# Patient Record
Sex: Male | Born: 1955 | Race: White | Hispanic: No | Marital: Married | Smoking: Never smoker
Health system: Southern US, Community
[De-identification: ages and names within clinical notes are randomized; demographics above are authoritative.]

## PROBLEM LIST (undated history)

## (undated) DIAGNOSIS — I499 Cardiac arrhythmia, unspecified: Secondary | ICD-10-CM

## (undated) DIAGNOSIS — M5416 Radiculopathy, lumbar region: Secondary | ICD-10-CM

## (undated) DIAGNOSIS — G8929 Other chronic pain: Secondary | ICD-10-CM

## (undated) DIAGNOSIS — G4730 Sleep apnea, unspecified: Secondary | ICD-10-CM

## (undated) DIAGNOSIS — E7800 Pure hypercholesterolemia, unspecified: Secondary | ICD-10-CM

## (undated) DIAGNOSIS — M4310 Spondylolisthesis, site unspecified: Secondary | ICD-10-CM

## (undated) HISTORY — DX: Radiculopathy, lumbar region: M54.16

## (undated) HISTORY — DX: Spondylolisthesis, site unspecified: M43.10

## (undated) HISTORY — DX: Pure hypercholesterolemia, unspecified: E78.00

## (undated) HISTORY — PX: ORIF WRIST FRACTURE: SHX2133

## (undated) HISTORY — DX: Other chronic pain: G89.29

---

## 2001-12-12 HISTORY — PX: SHOULDER OPEN ROTATOR CUFF REPAIR: SHX2407

## 2005-12-12 HISTORY — PX: SHOULDER OPEN ROTATOR CUFF REPAIR: SHX2407

## 2005-12-12 HISTORY — PX: APPENDECTOMY: SHX54

## 2019-09-19 ENCOUNTER — Other Ambulatory Visit (HOSPITAL_BASED_OUTPATIENT_CLINIC_OR_DEPARTMENT_OTHER): Payer: Self-pay | Admitting: Chiropractic Medicine

## 2019-09-19 DIAGNOSIS — M545 Low back pain, unspecified: Secondary | ICD-10-CM

## 2019-09-19 DIAGNOSIS — M25552 Pain in left hip: Secondary | ICD-10-CM

## 2019-09-25 ENCOUNTER — Ambulatory Visit (HOSPITAL_BASED_OUTPATIENT_CLINIC_OR_DEPARTMENT_OTHER)
Admission: RE | Admit: 2019-09-25 | Discharge: 2019-09-25 | Disposition: A | Discharge status: Home / Self Care | Payer: 59 | Source: Ambulatory Visit | Attending: Chiropractic Medicine | Admitting: Chiropractic Medicine

## 2019-09-25 ENCOUNTER — Other Ambulatory Visit: Payer: Self-pay

## 2019-09-25 DIAGNOSIS — M25552 Pain in left hip: Secondary | ICD-10-CM | POA: Diagnosis present

## 2019-09-25 DIAGNOSIS — M545 Low back pain, unspecified: Secondary | ICD-10-CM

## 2020-01-03 ENCOUNTER — Encounter: Payer: Self-pay | Admitting: Vascular Surgery

## 2020-01-03 ENCOUNTER — Other Ambulatory Visit: Payer: Self-pay | Admitting: *Deleted

## 2020-01-03 ENCOUNTER — Ambulatory Visit: Payer: Self-pay | Admitting: Orthopedic Surgery

## 2020-01-13 ENCOUNTER — Telehealth (HOSPITAL_COMMUNITY): Payer: Self-pay

## 2020-01-13 NOTE — Telephone Encounter (Signed)

## 2020-01-14 ENCOUNTER — Other Ambulatory Visit: Payer: Self-pay

## 2020-01-14 ENCOUNTER — Ambulatory Visit (INDEPENDENT_AMBULATORY_CARE_PROVIDER_SITE_OTHER): Payer: 59 | Admitting: Vascular Surgery

## 2020-01-14 DIAGNOSIS — M5442 Lumbago with sciatica, left side: Secondary | ICD-10-CM | POA: Diagnosis not present

## 2020-01-14 DIAGNOSIS — G8929 Other chronic pain: Secondary | ICD-10-CM

## 2020-01-14 DIAGNOSIS — M549 Dorsalgia, unspecified: Secondary | ICD-10-CM | POA: Insufficient documentation

## 2020-01-14 NOTE — Progress Notes (Signed)
Patient name: Timothy Brock MRN: [id number] DOB: [birth year] Sex: male  REASON FOR CONSULT: Evaluation for L4-L5 OLIF  HPI: Timothy Brock is a 64 y.o. male, with history of hyperlipidemia as well as chronic lower back pain who presents for evaluation of planned L4-L5 OLIF with Dr. Rolena Infante.  Patient states he has had chronic lower back pain with radiculopathy down the left leg for 7+ months if not longer.  He has failed conservative management including therapy and injections.  Dr. Rolena Infante has now recommended L4-L5 OLIF with anterior and posterior approach.  His only history of abdominal surgery includes laparoscopic appendectomy.  He still works as an Chief Financial Officer.  No history of smoking.  Past Medical History:  Diagnosis Date  . Chronic back pain   . Degenerative spondylolisthesis   . Hypercholesterolemia   . Lumbar radiculopathy     PSH: Appendectomy, right shoulder, left shoulder, left wrist  Family History  Problem Relation Age of Onset  . Heart disease Father     SOCIAL HISTORY: Social History   Socioeconomic History  . Marital status: Married    Spouse name: Not on file  . Number of children: Not on file  . Years of education: Not on file  . Highest education level: Not on file  Occupational History  . Not on file  Tobacco Use  . Smoking status: Never Smoker  . Smokeless tobacco: Never Used  Substance and Sexual Activity  . Alcohol use: Yes  . Drug use: Not on file  . Sexual activity: Not on file  Other Topics Concern  . Not on file  Social History Narrative  . Not on file   Social Determinants of Health   Financial Resource Strain:   . Difficulty of Paying Living Expenses: Not on file  Food Insecurity:   . Worried About Charity fundraiser in the Last Year: Not on file  . Ran Out of Food in the Last Year: Not on file  Transportation Needs:   . Lack of Transportation (Medical): Not on file  . Lack of Transportation (Non-Medical): Not on file  Physical Activity:     . Days of Exercise per Week: Not on file  . Minutes of Exercise per Session: Not on file  Stress:   . Feeling of Stress : Not on file  Social Connections:   . Frequency of Communication with Friends and Family: Not on file  . Frequency of Social Gatherings with Friends and Family: Not on file  . Attends Religious Services: Not on file  . Active Member of Clubs or Organizations: Not on file  . Attends Archivist Meetings: Not on file  . Marital Status: Not on file  Intimate Partner Violence:   . Fear of Current or Ex-Partner: Not on file  . Emotionally Abused: Not on file  . Physically Abused: Not on file  . Sexually Abused: Not on file    No Known Allergies  Current Outpatient Medications  Medication Sig Dispense Refill  . aspirin 81 MG EC tablet Take by mouth.    . meloxicam (MOBIC) 15 MG tablet meloxicam 15 mg tablet  Take 1 tablet by mouth once daily    . Multiple Vitamin (MULTIVITAMIN) capsule Take by mouth.    . rosuvastatin (CRESTOR) 10 MG tablet Take by mouth.    . rosuvastatin (CRESTOR) 20 MG tablet Take by mouth.    . traMADol (ULTRAM) 50 MG tablet tramadol 50 mg tablet  TAKE 1 TABLET BY MOUTH THREE  TIMES DAILY AS NEEDED FOR 21 DAYS     No current facility-administered medications for this visit.    REVIEW OF SYSTEMS:  [X]  denotes positive finding, [ ]  denotes negative finding Cardiac  Comments:  Chest pain or chest pressure:    Shortness of breath upon exertion:    Short of breath when lying flat:    Irregular heart rhythm:        Vascular    Pain in calf, thigh, or hip brought on by ambulation:    Pain in feet at night that wakes you up from your sleep:     Blood clot in your veins:    Leg swelling:         Pulmonary    Oxygen at home:    Productive cough:     Wheezing:         Neurologic    Sudden weakness in arms or legs:     Sudden numbness in arms or legs:     Sudden onset of difficulty speaking or slurred speech:    Temporary loss  of vision in one eye:     Problems with dizziness:     Back pain x   Gastrointestinal    Blood in stool:     Vomited blood:         Genitourinary    Burning when urinating:     Blood in urine:        Psychiatric    Major depression:         Hematologic    Bleeding problems:    Problems with blood clotting too easily:        Skin    Rashes or ulcers:        Constitutional    Fever or chills:      PHYSICAL EXAM: Vitals:   01/14/20 1453  BP: (!) 149/89  Pulse: 91  Resp: 16  Temp: 97.9 F (36.6 C)  TempSrc: Temporal  SpO2: 94%  Weight: 268 lb (121.6 kg)  Height: 5\' 10"  (1.778 m)    GENERAL: The patient is a well-nourished male, in no acute distress. The vital signs are documented above. CARDIAC: There is a regular rate and rhythm.  VASCULAR:  Palpable femoral pulses bilaterally Palpable dorsalis pedis pulse bilaterally PULMONARY: There is good air exchange bilaterally without wheezing or rales. ABDOMEN: Soft and non-tender with normal pitched bowel sounds.  MUSCULOSKELETAL: There are no major deformities or cyanosis. NEUROLOGIC: No focal weakness or paresthesias are detected. SKIN: There are no ulcers or rashes noted. PSYCHIATRIC: The patient has a normal affect.  DATA:   I independently reviewed his MR lumbar spine and his iliac vein bifurcation appears to be approximate L4, aortic bifurcation closer to L3.  Assessment/Plan:  64 year old male with history of chronic lower back pain with left lower leg radiculopathy that presents for preop evaluation of planned L4-L5 OLIF with Dr. 03/13/20.  I have reviewed his MRI imaging and think he would be a good candidate for OLIF.  I discussed the steps of the operation in detail with the patient and his wife including lateral decubitus positioning with incision over left oblique muscles.  We discussed risks and benefits including risk of injury to the left iliac artery or vein as well as injury to the intestines or left  ureter that all require mobliziation.  Look forward to assisting Dr. .  Questions and concerns answered.   69, MD [facility] Office:  [phone]    

## 2020-01-20 ENCOUNTER — Ambulatory Visit: Payer: Self-pay | Admitting: Orthopedic Surgery

## 2020-01-20 NOTE — H&P (Signed)
Subjective:    Jahquan is a pleasant 64 year old male who has had significant low back pain since January 2020 was initially seen by Dr. Shon Baton in October 2020. Despite conservative treatment measures including time, over-the-counter medication, necrotic medication, and injection therapy is to have significant pain. His main source of pain is his low back although he does complain of intermittent dysesthesias into his leg left leg greater than right. Despite these conservative treatment measures, the patient continues to have severe, debilitating pain affecting his quality-of-life and he would like to move forward with surgical intervention. He is scheduled for OLIF L4-5 w/ PSFI 01/29/20 at [facility] With Dr. Shon Baton. Patient Active Problem List   Diagnosis Date Noted  . Back pain 01/14/2020   Past Medical History:  Diagnosis Date  . Chronic back pain   . Degenerative spondylolisthesis   . Hypercholesterolemia   . Lumbar radiculopathy      Current Outpatient Medications  Medication Sig Dispense Refill Last Dose  . aspirin 81 MG EC tablet Take 81 mg by mouth daily.      . meloxicam (MOBIC) 15 MG tablet Take 15 mg by mouth daily.      . Multiple Vitamin (MULTIVITAMIN) capsule Take 1 capsule by mouth daily.      . rosuvastatin (CRESTOR) 20 MG tablet Take 20 mg by mouth daily.      . traMADol (ULTRAM) 50 MG tablet Take 50 mg by mouth 3 (three) times daily as needed for moderate pain.       No current facility-administered medications for this visit.   No Known Allergies  Social History   Tobacco Use  . Smoking status: Never Smoker  . Smokeless tobacco: Never Used  Substance Use Topics  . Alcohol use: Yes    Family History  Problem Relation Age of Onset  . Heart disease Father     Review of Systems As stated in HPI  Objective:   Vitals: Ht: 5 ft 10 in 01/20/2020 03:33 pm Wt: 254 lbs 01/20/2020 03:35 pm BMI: 36.4 01/20/2020 03:35 pm BP: 162/95 sitting L arm 01/20/2020 03:37  pm Pulse: 83 bpm 01/20/2020 03:37 pm  Clinical exam: Irma is a pleasant individual, who appears younger than their stated age. He Is alert and orientated 3.  Heart: Regular rate and rhythm, no rubs, murmurs, or gallops  Lungs: Clear auscultation bilaterally  Abdomen is soft and non-tender, negative loss of bowel and bladder control, no rebound tenderness. Bowel sounds 4  Negative: skin lesions abrasions contusions  Peripheral pulses: 2+ dorsalis pedis and posterior tibialis pulses bilaterally. Compartment soft and nontender.  Gait pattern: Normal. Patient does have significant pain when he arises from a seated position  Assistive devices: None  Neuro: Positive left straight leg raise test with numbness and dysesthesias in the L5 dermatome. 5/5 motor strength bilaterally in the lower extremity. Symmetrical 2+ deep tendon reflexes at the knee and Achilles, negative Babinski test.  Musculoskeletal: Significant back and left lateral hip pain with direct palpation. Pain does radiate into the left lower extremity. He denies any significant hip, knee, ankle pain with isolated joint range of motion  X-rays of the lumbar spine demonstrate some mild degenerative disease at L4-5. No scoliosis or spondylolisthesis is noted.  Lumbar MRI: completed on 10/11/19 was reviewed with the patient. I have also reviewed the radiology report. Degenerative grade 1 slip at L4-5 with severe bilateral facet arthrosis and facet hypertrophy. Mild central stenosis, moderate lateral recess stenosis. There is a small left anteriorly directed  synovial cyst with moderate left neuroforaminal narrowing. Mild degenerative disease L3-4 and L5-S1. No abnormal marrow replacing lesion or fracture is seen.  Patient reports approximately 5 days of significant pain relief status post the epidural injection.  Assessment:   At this point time Advay states his quality-of-life is quite poor. He did note 4-5 days of improvement with  the epidural injection but now he is having debilitating pain. He is interested in moving forward with a surgical solution for his issues. His primary source of pain is back pain. He states that 80% of his problem revolves around his lumbar spine. While he does have some episodes of neuropathic leg pain it is minor compared to his back pain.  At this point time in order to address the degenerative spondylous thesis and back pain recommending an oblique lumbar interbody fusion. This would allow for removal of the painful disc and restoration of his sagittal alignment and stabilization of the intervertebral space with a cage. I would then supplement this with posterior pedicle screw fixation.  Plan:   I have gone over the risks and benefits of surgery with the patient and his wife in great detail and all of their questions were encouraged.  Risks, benefits of surgery were reviewed with the patient. These include: infection, bleeding, death, stroke, paralysis, ongoing or worse pain, need for additional surgery, injury to the lumbar plexus resulting in hip flexor weakness and difficulty walking without assistive devices. Adjacent segment degenerative disease, need for additional surgery including fusing other levels, leak of spinal fluid, Nonunion, hardware failure, breakage, or mal-position. Deep venous thrombosis (DVT) requiring additional treatment such as filter, and/or medications. Injury to abdominal contents, loss in bowel and bladder control.  Risks and benefits of spinal fusion: Infection, bleeding, death, stroke, paralysis, ongoing or worse pain, need for additional surgery, nonunion, leak of spinal fluid, adjacent segment degeneration requiring additional fusion surgery, Injury to abdominal vessels that can require anterior surgery to stop bleeding. Malposition of the cage and/or pedicle screws that could require additional surgery. Loss of bowel and bladder control. Postoperative hematoma causing  neurologic compression that could require urgent or emergent re-operation.  We have obtained preoperative clearance from the patient's primary care provider. The primary care provider wrote that the patient should "continue all medications" however, he was on meloxicam and aspirin and therefore we will contact the patient's primary care provider and make sure that he is able to discontinue these medications without significant increased risk. The patient states that his primary care doctor said the aspirin was "up to the surgeon," however I would like updated documentation if this is the case.  We have reviewed his medication list. He is taking aspirin 81 mg, which I would recommend he hold 7 days prior to surgery and we will restart 48 hours after. I have also advised him to hold his meloxicam. I have also advised him not to take any over-the-counter anti-inflammatory medications. He will take Tylenol and tramadol as needed for pain.  We have also discussed the post-operative recovery period to include: bathing/showering restrictions, wound healing, activity (and driving) restrictions, medications/pain mangement.  We have also discussed post-operative redflags to include: signs and symptoms of postoperative infection, DVT/PE.  Patient will be fitted for LSO brace by physical therapy today  Patient has preoperative testing at [facility] scheduled  All patients questions were invited and answered  Plan is to move forward with surgical intervention as scheduled pending preoperative labs at [facility].  F/u: 2  weeks post-op

## 2020-01-24 NOTE — Progress Notes (Signed)
[facility] [address] [place], [place] - [address] [place] [address] Phone: [phone] Fax: [phone]      Your procedure is scheduled on Wednesday Jan 29, 2020.  Report to [facility] Main Entrance "A" at 06:30 A.M., and check in at the Admitting office.  Call this number if you have problems the morning of surgery:  [phone]  Call [phone] if you have any questions prior to your surgery date Monday-Friday 8am-4pm    Remember:  Do not eat or drink after midnight the night before your surgery   Take these medicines the morning of surgery with A SIP OF WATER: rosuvastatin (CRESTOR) traMADol (ULTRAM) - as needed  Follow your surgeon's instructions on when to stop Aspirin.  If no instructions were given by your surgeon then you will need to call the office to get those instructions.    7 days prior to surgery STOP taking any meloxicam (MOBIC), Aleve, Naproxen, Ibuprofen, Motrin, Advil, Goody's, BC's, all herbal medications, fish oil, and all vitamins.    The Morning of Surgery  Do not wear jewelry.  Do not wear lotions, powders, colognes, or deodorant  Men may shave face and neck.  Do not bring valuables to the hospital.  [facility] is not responsible for any belongings or valuables.  If you are a smoker, DO NOT Smoke 24 hours prior to surgery  If you wear a CPAP at night please bring your mask the morning of surgery   Remember that you must have someone to transport you home after your surgery, and remain with you for 24 hours if you are discharged the same day.   Please bring cases for contacts, glasses, hearing aids, dentures or bridgework because it cannot be worn into surgery.    Leave your suitcase in the car.  After surgery it may be brought to your room.  For patients admitted to the hospital, discharge time will be determined by your treatment team.  Patients discharged the day of surgery will not be  allowed to drive home.    Special instructions:   [facility]- Preparing For Surgery  Before surgery, you can play an important role. Because skin is not sterile, your skin needs to be as free of germs as possible. You can reduce the number of germs on your skin by washing with CHG (chlorahexidine gluconate) Soap before surgery.  CHG is an antiseptic cleaner which kills germs and bonds with the skin to continue killing germs even after washing.    Oral Hygiene is also important to reduce your risk of infection.  Remember - BRUSH YOUR TEETH THE MORNING OF SURGERY WITH YOUR REGULAR TOOTHPASTE  Please do not use if you have an allergy to CHG or antibacterial soaps. If your skin becomes reddened/irritated stop using the CHG.  Do not shave (including legs and underarms) for at least 48 hours prior to first CHG shower. It is OK to shave your face.  Please follow these instructions carefully.   1. Shower the NIGHT BEFORE SURGERY and the MORNING OF SURGERY with CHG Soap.   2. If you chose to wash your hair, wash your hair first as usual with your normal shampoo.  3. After you shampoo, rinse your hair and body thoroughly to remove the shampoo.  4. Use CHG as you would any other liquid soap. You can apply CHG directly to the skin and wash gently with a scrungie or a clean washcloth.  5. Apply the CHG Soap to your body ONLY FROM THE NECK DOWN.  Do not use on open wounds or open sores. Avoid contact with your eyes, ears, mouth and genitals (private parts). Wash Face and genitals (private parts)  with your normal soap.   6. Wash thoroughly, paying special attention to the area where your surgery will be performed.  7. Thoroughly rinse your body with warm water from the neck down.  8. DO NOT shower/wash with your normal soap after using and rinsing off the CHG Soap.  9. Pat yourself dry with a CLEAN TOWEL.  10. Wear CLEAN PAJAMAS to bed the night before surgery, wear comfortable clothes the  morning of surgery  11. Place CLEAN SHEETS on your bed the night of your first shower and DO NOT SLEEP WITH PETS.    Day of Surgery:  Please shower the morning of surgery with the CHG soap Do not apply any deodorants/lotions. Please wear clean clothes to the hospital/surgery center.   Remember to brush your teeth WITH YOUR REGULAR TOOTHPASTE.   Please read over the following fact sheets that you were given.

## 2020-01-27 ENCOUNTER — Other Ambulatory Visit: Payer: Self-pay

## 2020-01-27 ENCOUNTER — Other Ambulatory Visit (HOSPITAL_COMMUNITY)
Admission: RE | Admit: 2020-01-27 | Discharge: 2020-01-27 | Disposition: A | Discharge status: Home / Self Care | Payer: 59 | Source: Ambulatory Visit | Attending: Orthopedic Surgery | Admitting: Orthopedic Surgery

## 2020-01-27 ENCOUNTER — Encounter (HOSPITAL_COMMUNITY): Payer: Self-pay

## 2020-01-27 ENCOUNTER — Ambulatory Visit (HOSPITAL_COMMUNITY)
Admission: RE | Admit: 2020-01-27 | Discharge: 2020-01-27 | Disposition: A | Discharge status: Home / Self Care | Payer: 59 | Source: Ambulatory Visit | Attending: Orthopedic Surgery | Admitting: Orthopedic Surgery

## 2020-01-27 ENCOUNTER — Encounter (HOSPITAL_COMMUNITY)
Admission: RE | Admit: 2020-01-27 | Discharge: 2020-01-27 | Disposition: A | Discharge status: Home / Self Care | Payer: 59 | Source: Ambulatory Visit | Attending: Orthopedic Surgery | Admitting: Orthopedic Surgery

## 2020-01-27 DIAGNOSIS — Z01818 Encounter for other preprocedural examination: Secondary | ICD-10-CM

## 2020-01-27 HISTORY — DX: Cardiac arrhythmia, unspecified: I49.9

## 2020-01-27 HISTORY — DX: Sleep apnea, unspecified: G47.30

## 2020-01-27 LAB — ABO/RH: ABO/RH(D): A POS

## 2020-01-27 LAB — TYPE AND SCREEN
ABO/RH(D): A POS
Antibody Screen: NEGATIVE

## 2020-01-27 LAB — BASIC METABOLIC PANEL
Anion gap: 13 (ref 5–15)
BUN: 18 mg/dL (ref 8–23)
CO2: 22 mmol/L (ref 22–32)
Calcium: 9.2 mg/dL (ref 8.9–10.3)
Chloride: 107 mmol/L (ref 98–111)
Creatinine, Ser: 0.89 mg/dL (ref 0.61–1.24)
GFR calc Af Amer: >60 mL/min (ref >60)
GFR calc non Af Amer: >60 mL/min (ref >60)
Glucose, Bld: 109 mg/dL — ABNORMAL HIGH (ref 70–99)
Potassium: 4.1 mmol/L (ref 3.5–5.1)
Sodium: 142 mmol/L (ref 135–145)

## 2020-01-27 LAB — CBC
HCT: 49.8 % (ref 39.0–52.0)
Hemoglobin: 16.6 g/dL (ref 13.0–17.0)
MCH: 31.9 pg (ref 26.0–34.0)
MCHC: 33.3 g/dL (ref 30.0–36.0)
MCV: 95.6 fL (ref 80.0–100.0)
Platelets: 203 10*3/uL (ref 150–400)
RBC: 5.21 MIL/uL (ref 4.22–5.81)
RDW: 12.7 % (ref 11.5–15.5)
WBC: 4.6 10*3/uL (ref 4.0–10.5)
nRBC: 0 % (ref 0.0–0.2)

## 2020-01-27 LAB — SURGICAL PCR SCREEN
MRSA, PCR: NEGATIVE
Staphylococcus aureus: NEGATIVE

## 2020-01-27 LAB — URINALYSIS, ROUTINE W REFLEX MICROSCOPIC
Bilirubin Urine: NEGATIVE
Glucose, UA: NEGATIVE mg/dL
Hgb urine dipstick: NEGATIVE
Ketones, ur: NEGATIVE mg/dL
Leukocytes,Ua: NEGATIVE
Nitrite: NEGATIVE
Protein, ur: NEGATIVE mg/dL
Specific Gravity, Urine: 1.023 (ref 1.005–1.030)
pH: 5 (ref 5.0–8.0)

## 2020-01-27 LAB — APTT: aPTT: 32 seconds (ref 24–36)

## 2020-01-27 LAB — SARS CORONAVIRUS 2 (TAT 6-24 HRS): SARS Coronavirus 2: NEGATIVE

## 2020-01-27 LAB — PROTIME-INR
INR: 1 (ref 0.8–1.2)
Prothrombin Time: 12.6 seconds (ref 11.4–15.2)

## 2020-01-27 NOTE — Progress Notes (Addendum)
PCP - Dr. Lynn Ito Cardiologist - denies  PPM/ICD - denies  Chest x-ray -  01/27/2020 EKG - 11/21/2019 (C.E.) - tracing requested Stress Test - per patient about 23-30 years ago ECHO - denies Cardiac Cath - denies  Sleep Study - 2019 (C.E) CPAP - per patient has never worn a CPAP  Blood Thinner Instructions: N/A Aspirin Instructions: per patient LD 01/20/2020  ERAS Protcol - No  COVID TEST- Scheduled for today after PAT appointment. Patient verbalized understanding of self-quarantine instructions, appointment time and place.  Anesthesia review: YES, per surgeon's order, medical clearance  Patient denies shortness of breath, fever, cough and chest pain at PAT appointment  All instructions explained to the patient, with a verbal understanding of the material. Patient agrees to go over the instructions while at home for a better understanding. Patient also instructed to self quarantine after being tested for COVID-19. The opportunity to ask questions was provided.

## 2020-01-27 NOTE — Anesthesia Preprocedure Evaluation (Addendum)
Anesthesia Evaluation  Patient identified by MRN, date of birth, ID band Patient awake    Reviewed: Allergy & Precautions, NPO status , Patient's Chart, lab work & pertinent test results  Airway Mallampati: II  TM Distance: >3 FB Neck ROM: Full    Dental no notable dental hx.    Pulmonary sleep apnea ,    Pulmonary exam normal breath sounds clear to auscultation       Cardiovascular negative cardio ROS Normal cardiovascular exam Rhythm:Regular Rate:Normal     Neuro/Psych negative neurological ROS  negative psych ROS   GI/Hepatic negative GI ROS, Neg liver ROS,   Endo/Other  Morbid obesity  Renal/GU negative Renal ROS  negative genitourinary   Musculoskeletal negative musculoskeletal ROS (+)   Abdominal   Peds negative pediatric ROS (+)  Hematology negative hematology ROS (+)   Anesthesia Other Findings   Reproductive/Obstetrics negative OB ROS                             Anesthesia Physical Anesthesia Plan  ASA: III  Anesthesia Plan: General   Post-op Pain Management:    Induction: Intravenous  PONV Risk Score and Plan: 2 and Ondansetron, Dexamethasone and Treatment may vary due to age or medical condition  Airway Management Planned: Oral ETT  Additional Equipment:   Intra-op Plan:   Post-operative Plan: Extubation in OR  Informed Consent: I have reviewed the patients History and Physical, chart, labs and discussed the procedure including the risks, benefits and alternatives for the proposed anesthesia with the patient or authorized representative who has indicated his/her understanding and acceptance.     Dental advisory given  Plan Discussed with: CRNA and Surgeon  Anesthesia Plan Comments: (PAT note written 01/27/2020 by Shonna Chock, PA-C. )       Anesthesia Quick Evaluation

## 2020-01-27 NOTE — Progress Notes (Signed)
Anesthesia Chart Review:  Case: [id number] Date/Time: 01/29/20 0815   Procedures:      Obliquie lumbar interbody fusion L4-5 wiht posterior spinal fusion interbody (N/A ) - 4.5 hrs Dr. Carlis Abbott to do approach Left sided tap block with exparel     ABDOMINAL EXPOSURE (N/A )   Anesthesia type: General   Pre-op diagnosis: Grade 1 degenerative sliip L4-5 with back and radicular leg pain   Location: [facility] ROOM 04 / [facility] OR   Surgeons: Melina Schools, MD; [name], MD      DISCUSSION: Patient is a 64 year old male scheduled for the above procedure.  History includes never smoker, hypercholesterolemia, chronic back pain, OSA (does not wear CPAP), dysrhythmia (report remote history of "skipped" beats treated with diltiazem). Tested positive for SARS-CoV2 08/20/19 ([place]).  Last ASA 01/20/20. His PCP Dr. Sinclair Ship cleared him for surgery following 11/21/19 visit with EKG and labs (EKG tracing requested). He reportedly was diagnosed with OSA > 10 years ago, but has never worn CPAP--her note indicates that he was unable to sleep very much in the sleep lab, so he did not feel his initial OSA diagnosis was accurate. He denied SOB, cough, fever, chest pain at PAT RN visit. 01/27/20 COVID-19 presurgical test is in process. If negative then I would anticipate that he can proceed as planned if no acute changes.    VS: BP (!) 136/94   Pulse 91   Temp 36.9 C (Oral)   Resp 18   Ht 5\' 10"  (1.778 m)   Wt 120.4 kg   SpO2 95%   BMI 38.10 kg/m  As of 01/24/20, home BP checks were ranging 126-18/80's.   PROVIDERS: Sinclair Ship, MD is PCP ([place] [facility]; see Care Everywhere). Seen for preoperative evaluation 01/21/19.    LABS: Labs reviewed: Acceptable for surgery. A1c 6.1% 11/18/19 [facility]). (all labs ordered are listed, but only abnormal results are displayed)  Labs Reviewed  BASIC METABOLIC PANEL - Abnormal; Notable for the following components:      Result Value    Glucose, Bld 109 (*)    All other components within normal limits  SURGICAL PCR SCREEN  APTT  CBC  PROTIME-INR  URINALYSIS, ROUTINE W REFLEX MICROSCOPIC  TYPE AND SCREEN  ABO/RH     IMAGES: CXR 01/27/20: FINDINGS: The heart size and mediastinal contours are within normal limits. Both lungs are clear. The visualized skeletal structures are unremarkable. IMPRESSION: No active cardiopulmonary disease.   EKG: 11/21/19 [facility]): Tracing Requested. Per Result Narrative: Sinus rhythm Normal ECG No previous ECGs available Confirmed by Larae Grooms ([id number]) on 11/21/2019 9:25:49 PM   CV: Reported remote history of stress test (> 20 years ago) around the time of his "skipped" beats.    Past Medical History:  Diagnosis Date  . Chronic back pain   . Degenerative spondylolisthesis   . Dysrhythmia    per patient about 23-30 years ago "heart would skip a beat was on cardizem for it"  . Hypercholesterolemia   . Lumbar radiculopathy   . Sleep apnea    per patient diagnosed "about 20 years ago, has never worn a CPAP machine" Care Everywhere shows recent sleep study done in 2019    Past Surgical History:  Procedure Laterality Date  . APPENDECTOMY  2007  . ORIF WRIST FRACTURE Left   . SHOULDER OPEN ROTATOR CUFF REPAIR Right 2003  . SHOULDER OPEN ROTATOR CUFF REPAIR Left 2007  MEDICATIONS: . aspirin 81 MG EC tablet  . meloxicam (MOBIC) 15 MG tablet  . Multiple Vitamin (MULTIVITAMIN) capsule  . rosuvastatin (CRESTOR) 20 MG tablet  . traMADol (ULTRAM) 50 MG tablet   No current facility-administered medications for this encounter.    Shonna Chock, PA-C Surgical Short Stay/Anesthesiology [facility] Phone [phone] [facility] Phone [phone] 01/27/2020 4:03 PM

## 2020-01-28 MED ORDER — DEXTROSE 5 % IV SOLN
3.0000 g | INTRAVENOUS | Status: AC
  Administered 2020-01-29 (×2): 3 g via INTRAVENOUS
  Filled 2020-01-28: qty 3

## 2020-01-29 ENCOUNTER — Other Ambulatory Visit: Payer: Self-pay

## 2020-01-29 ENCOUNTER — Inpatient Hospital Stay (HOSPITAL_COMMUNITY): Payer: 59 | Admitting: Physician Assistant

## 2020-01-29 ENCOUNTER — Encounter (HOSPITAL_COMMUNITY)
Admission: RE | Disposition: A | Discharge status: Home / Self Care | Payer: Self-pay | Source: Home / Self Care | Attending: Orthopedic Surgery

## 2020-01-29 ENCOUNTER — Inpatient Hospital Stay (HOSPITAL_COMMUNITY): Payer: 59

## 2020-01-29 ENCOUNTER — Inpatient Hospital Stay (HOSPITAL_COMMUNITY)
Admission: RE | Admit: 2020-01-29 | Discharge: 2020-01-30 | DRG: 460 | Disposition: A | Discharge status: Home / Self Care | Payer: 59 | Attending: Orthopedic Surgery | Admitting: Orthopedic Surgery

## 2020-01-29 ENCOUNTER — Encounter (HOSPITAL_COMMUNITY): Payer: Self-pay | Admitting: Orthopedic Surgery

## 2020-01-29 ENCOUNTER — Inpatient Hospital Stay (HOSPITAL_COMMUNITY): Payer: 59 | Admitting: Certified Registered"

## 2020-01-29 DIAGNOSIS — E669 Obesity, unspecified: Secondary | ICD-10-CM | POA: Diagnosis present

## 2020-01-29 DIAGNOSIS — Z6838 Body mass index (BMI) 38.0-38.9, adult: Secondary | ICD-10-CM

## 2020-01-29 DIAGNOSIS — Z791 Long term (current) use of non-steroidal anti-inflammatories (NSAID): Secondary | ICD-10-CM | POA: Diagnosis not present

## 2020-01-29 DIAGNOSIS — M4316 Spondylolisthesis, lumbar region: Secondary | ICD-10-CM | POA: Diagnosis present

## 2020-01-29 DIAGNOSIS — M4326 Fusion of spine, lumbar region: Secondary | ICD-10-CM | POA: Diagnosis present

## 2020-01-29 DIAGNOSIS — R066 Hiccough: Secondary | ICD-10-CM | POA: Diagnosis not present

## 2020-01-29 DIAGNOSIS — Z20822 Contact with and (suspected) exposure to covid-19: Secondary | ICD-10-CM | POA: Diagnosis present

## 2020-01-29 DIAGNOSIS — G8929 Other chronic pain: Secondary | ICD-10-CM | POA: Diagnosis present

## 2020-01-29 DIAGNOSIS — Z7982 Long term (current) use of aspirin: Secondary | ICD-10-CM | POA: Diagnosis not present

## 2020-01-29 DIAGNOSIS — Z5309 Procedure and treatment not carried out because of other contraindication: Secondary | ICD-10-CM | POA: Diagnosis present

## 2020-01-29 DIAGNOSIS — E7800 Pure hypercholesterolemia, unspecified: Secondary | ICD-10-CM | POA: Diagnosis present

## 2020-01-29 DIAGNOSIS — Z79899 Other long term (current) drug therapy: Secondary | ICD-10-CM | POA: Diagnosis not present

## 2020-01-29 DIAGNOSIS — Z419 Encounter for procedure for purposes other than remedying health state, unspecified: Secondary | ICD-10-CM

## 2020-01-29 DIAGNOSIS — G4730 Sleep apnea, unspecified: Secondary | ICD-10-CM | POA: Diagnosis present

## 2020-01-29 DIAGNOSIS — M5416 Radiculopathy, lumbar region: Secondary | ICD-10-CM | POA: Diagnosis present

## 2020-01-29 DIAGNOSIS — Z8249 Family history of ischemic heart disease and other diseases of the circulatory system: Secondary | ICD-10-CM | POA: Diagnosis not present

## 2020-01-29 HISTORY — PX: ANTERIOR LUMBAR FUSION: SHX1170

## 2020-01-29 HISTORY — PX: ABDOMINAL EXPOSURE: SHX5708

## 2020-01-29 SURGERY — ANTERIOR LUMBAR FUSION 1 LEVEL
Anesthesia: General | Site: Spine Lumbar

## 2020-01-29 MED ORDER — ONDANSETRON HCL 4 MG/2ML IJ SOLN
INTRAMUSCULAR | Status: DC | PRN
  Administered 2020-01-29: 4 mg via INTRAVENOUS

## 2020-01-29 MED ORDER — MIDAZOLAM HCL 5 MG/5ML IJ SOLN
INTRAMUSCULAR | Status: DC | PRN
  Administered 2020-01-29: 2 mg via INTRAVENOUS

## 2020-01-29 MED ORDER — BUPIVACAINE LIPOSOME 1.3 % IJ SUSP
20.0000 mL | INTRAMUSCULAR | Status: DC
  Filled 2020-01-29: qty 20

## 2020-01-29 MED ORDER — EPINEPHRINE PF 1 MG/ML IJ SOLN
INTRAMUSCULAR | Status: DC | PRN
  Administered 2020-01-29: .15 mL

## 2020-01-29 MED ORDER — MORPHINE SULFATE (PF) 2 MG/ML IV SOLN: 2.0000 mg | INTRAVENOUS | Status: DC | PRN

## 2020-01-29 MED ORDER — KETOROLAC TROMETHAMINE 30 MG/ML IJ SOLN
INTRAMUSCULAR | Status: AC
  Filled 2020-01-29: qty 1

## 2020-01-29 MED ORDER — GABAPENTIN 300 MG PO CAPS
300.0000 mg | ORAL_CAPSULE | Freq: Three times a day (TID) | ORAL | Status: DC
  Administered 2020-01-29 – 2020-01-30 (×3): 300 mg via ORAL
  Filled 2020-01-29 (×3): qty 1

## 2020-01-29 MED ORDER — FENTANYL CITRATE (PF) 250 MCG/5ML IJ SOLN
INTRAMUSCULAR | Status: AC
  Filled 2020-01-29: qty 5

## 2020-01-29 MED ORDER — ACETAMINOPHEN 325 MG PO TABS
650.0000 mg | ORAL_TABLET | ORAL | Status: DC | PRN
  Administered 2020-01-29 – 2020-01-30 (×2): 650 mg via ORAL
  Filled 2020-01-29 (×2): qty 2

## 2020-01-29 MED ORDER — LIDOCAINE IN D5W 4-5 MG/ML-% IV SOLN
INTRAVENOUS | Status: DC | PRN
  Administered 2020-01-29: 2 mg/min via INTRAVENOUS

## 2020-01-29 MED ORDER — DEXAMETHASONE SODIUM PHOSPHATE 10 MG/ML IJ SOLN
INTRAMUSCULAR | Status: DC | PRN
  Administered 2020-01-29: 4 mg via INTRAVENOUS

## 2020-01-29 MED ORDER — KETOROLAC TROMETHAMINE 30 MG/ML IJ SOLN
30.0000 mg | Freq: Once | INTRAMUSCULAR | Status: AC | PRN
  Administered 2020-01-29: 30 mg via INTRAVENOUS

## 2020-01-29 MED ORDER — POLYETHYLENE GLYCOL 3350 17 G PO PACK: 17.0000 g | PACK | Freq: Every day | ORAL | Status: DC | PRN

## 2020-01-29 MED ORDER — LACTATED RINGERS IV SOLN: INTRAVENOUS | Status: DC | PRN

## 2020-01-29 MED ORDER — LACTATED RINGERS IV SOLN: INTRAVENOUS | Status: DC

## 2020-01-29 MED ORDER — TRANEXAMIC ACID-NACL 1000-0.7 MG/100ML-% IV SOLN
INTRAVENOUS | Status: DC | PRN
  Administered 2020-01-29: 1000 mg via INTRAVENOUS

## 2020-01-29 MED ORDER — BUPIVACAINE HCL (PF) 0.25 % IJ SOLN
INTRAMUSCULAR | Status: AC
  Filled 2020-01-29: qty 30

## 2020-01-29 MED ORDER — THROMBIN (RECOMBINANT) 5000 UNITS EX SOLR
CUTANEOUS | Status: AC
  Filled 2020-01-29: qty 5000

## 2020-01-29 MED ORDER — ONDANSETRON HCL 4 MG PO TABS: 4.0000 mg | ORAL_TABLET | Freq: Four times a day (QID) | ORAL | Status: DC | PRN

## 2020-01-29 MED ORDER — BUPIVACAINE HCL (PF) 0.25 % IJ SOLN
INTRAMUSCULAR | Status: DC | PRN
  Administered 2020-01-29: 10 mL

## 2020-01-29 MED ORDER — ROCURONIUM BROMIDE 10 MG/ML (PF) SYRINGE
PREFILLED_SYRINGE | INTRAVENOUS | Status: AC
  Filled 2020-01-29: qty 10

## 2020-01-29 MED ORDER — DEXAMETHASONE 4 MG PO TABS
4.0000 mg | ORAL_TABLET | Freq: Four times a day (QID) | ORAL | Status: DC
  Administered 2020-01-29: 4 mg via ORAL
  Filled 2020-01-29: qty 1

## 2020-01-29 MED ORDER — ONDANSETRON HCL 4 MG/2ML IJ SOLN: 4.0000 mg | Freq: Four times a day (QID) | INTRAMUSCULAR | Status: DC | PRN

## 2020-01-29 MED ORDER — METHOCARBAMOL 500 MG PO TABS: 500.0000 mg | ORAL_TABLET | Freq: Three times a day (TID) | ORAL | 0 refills | Status: AC | PRN

## 2020-01-29 MED ORDER — 0.9 % SODIUM CHLORIDE (POUR BTL) OPTIME
TOPICAL | Status: DC | PRN
  Administered 2020-01-29 (×3): 1000 mL

## 2020-01-29 MED ORDER — SODIUM CHLORIDE 0.9 % IV SOLN: 250.0000 mL | INTRAVENOUS | Status: DC

## 2020-01-29 MED ORDER — SODIUM CHLORIDE 0.9% FLUSH: 3.0000 mL | INTRAVENOUS | Status: DC | PRN

## 2020-01-29 MED ORDER — LIDOCAINE IN D5W 4-5 MG/ML-% IV SOLN
1.0000 mg/min | INTRAVENOUS | Status: DC
  Filled 2020-01-29: qty 500

## 2020-01-29 MED ORDER — LIDOCAINE 2% (20 MG/ML) 5 ML SYRINGE
INTRAMUSCULAR | Status: AC
  Filled 2020-01-29: qty 5

## 2020-01-29 MED ORDER — CHLORHEXIDINE GLUCONATE 4 % EX LIQD: 60.0000 mL | Freq: Once | CUTANEOUS | Status: DC

## 2020-01-29 MED ORDER — MENTHOL 3 MG MT LOZG: 1.0000 | LOZENGE | OROMUCOSAL | Status: DC | PRN

## 2020-01-29 MED ORDER — DOCUSATE SODIUM 100 MG PO CAPS
100.0000 mg | ORAL_CAPSULE | Freq: Two times a day (BID) | ORAL | Status: DC
  Administered 2020-01-29: 100 mg via ORAL
  Filled 2020-01-29 (×2): qty 1

## 2020-01-29 MED ORDER — OXYCODONE HCL 5 MG/5ML PO SOLN: 5.0000 mg | Freq: Once | ORAL | Status: DC | PRN

## 2020-01-29 MED ORDER — METHOCARBAMOL 500 MG PO TABS
500.0000 mg | ORAL_TABLET | Freq: Four times a day (QID) | ORAL | Status: DC | PRN
  Administered 2020-01-29 – 2020-01-30 (×2): 500 mg via ORAL
  Filled 2020-01-29 (×2): qty 1

## 2020-01-29 MED ORDER — HYDROMORPHONE HCL 1 MG/ML IJ SOLN
INTRAMUSCULAR | Status: DC | PRN
  Administered 2020-01-29: 1 mg via INTRAVENOUS

## 2020-01-29 MED ORDER — HYDROMORPHONE HCL 1 MG/ML IJ SOLN
INTRAMUSCULAR | Status: AC
  Filled 2020-01-29: qty 0.5

## 2020-01-29 MED ORDER — ONDANSETRON HCL 4 MG PO TABS: 4.0000 mg | ORAL_TABLET | Freq: Three times a day (TID) | ORAL | 0 refills | Status: AC | PRN

## 2020-01-29 MED ORDER — OXYCODONE-ACETAMINOPHEN 10-325 MG PO TABS: 1.0000 | ORAL_TABLET | Freq: Four times a day (QID) | ORAL | 0 refills | Status: AC | PRN

## 2020-01-29 MED ORDER — HEMOSTATIC AGENTS (NO CHARGE) OPTIME
TOPICAL | Status: DC | PRN
  Administered 2020-01-29: 2

## 2020-01-29 MED ORDER — HYDROMORPHONE HCL 1 MG/ML IJ SOLN
0.2500 mg | INTRAMUSCULAR | Status: DC | PRN
  Administered 2020-01-29: 0.25 mg via INTRAVENOUS

## 2020-01-29 MED ORDER — PHENOL 1.4 % MT LIQD: 1.0000 | OROMUCOSAL | Status: DC | PRN

## 2020-01-29 MED ORDER — TRANEXAMIC ACID-NACL 1000-0.7 MG/100ML-% IV SOLN
INTRAVENOUS | Status: AC
  Filled 2020-01-29: qty 100

## 2020-01-29 MED ORDER — ROCURONIUM BROMIDE 10 MG/ML (PF) SYRINGE
PREFILLED_SYRINGE | INTRAVENOUS | Status: DC | PRN
  Administered 2020-01-29: 80 mg via INTRAVENOUS
  Administered 2020-01-29: 10 mg via INTRAVENOUS
  Administered 2020-01-29: 20 mg via INTRAVENOUS
  Administered 2020-01-29: 10 mg via INTRAVENOUS

## 2020-01-29 MED ORDER — ROPIVACAINE HCL 5 MG/ML IJ SOLN
INTRAMUSCULAR | Status: DC | PRN
  Administered 2020-01-29: 30 mL

## 2020-01-29 MED ORDER — SODIUM CHLORIDE 0.9% FLUSH: 3.0000 mL | Freq: Two times a day (BID) | INTRAVENOUS | Status: DC

## 2020-01-29 MED ORDER — PROMETHAZINE HCL 25 MG/ML IJ SOLN: 6.2500 mg | INTRAMUSCULAR | Status: DC | PRN

## 2020-01-29 MED ORDER — FLEET ENEMA 7-19 GM/118ML RE ENEM: 1.0000 | ENEMA | Freq: Once | RECTAL | Status: DC | PRN

## 2020-01-29 MED ORDER — MAGNESIUM CITRATE PO SOLN
1.0000 | Freq: Once | ORAL | Status: AC
  Administered 2020-01-29: 1 via ORAL
  Filled 2020-01-29: qty 296

## 2020-01-29 MED ORDER — HYDROMORPHONE HCL 1 MG/ML IJ SOLN
INTRAMUSCULAR | Status: AC
  Filled 2020-01-29: qty 1

## 2020-01-29 MED ORDER — PHENYLEPHRINE HCL-NACL 10-0.9 MG/250ML-% IV SOLN
INTRAVENOUS | Status: DC | PRN
  Administered 2020-01-29: 20 ug/min via INTRAVENOUS

## 2020-01-29 MED ORDER — CEFAZOLIN SODIUM-DEXTROSE 1-4 GM/50ML-% IV SOLN
1.0000 g | Freq: Three times a day (TID) | INTRAVENOUS | Status: AC
  Administered 2020-01-29 – 2020-01-30 (×2): 1 g via INTRAVENOUS
  Filled 2020-01-29 (×2): qty 50

## 2020-01-29 MED ORDER — LIDOCAINE 2% (20 MG/ML) 5 ML SYRINGE
INTRAMUSCULAR | Status: DC | PRN
  Administered 2020-01-29: 100 mg via INTRAVENOUS

## 2020-01-29 MED ORDER — SUGAMMADEX SODIUM 200 MG/2ML IV SOLN
INTRAVENOUS | Status: DC | PRN
  Administered 2020-01-29: 240 mg via INTRAVENOUS

## 2020-01-29 MED ORDER — OXYCODONE HCL 5 MG PO TABS: 5.0000 mg | ORAL_TABLET | Freq: Once | ORAL | Status: DC | PRN

## 2020-01-29 MED ORDER — DEXAMETHASONE SODIUM PHOSPHATE 4 MG/ML IJ SOLN
4.0000 mg | Freq: Four times a day (QID) | INTRAMUSCULAR | Status: DC
  Administered 2020-01-29 – 2020-01-30 (×2): 4 mg via INTRAVENOUS
  Filled 2020-01-29 (×2): qty 1

## 2020-01-29 MED ORDER — OXYCODONE HCL 5 MG PO TABS
5.0000 mg | ORAL_TABLET | ORAL | Status: DC | PRN
  Administered 2020-01-29 – 2020-01-30 (×4): 10 mg via ORAL
  Filled 2020-01-29 (×4): qty 2

## 2020-01-29 MED ORDER — METHOCARBAMOL 1000 MG/10ML IJ SOLN
500.0000 mg | Freq: Four times a day (QID) | INTRAVENOUS | Status: DC | PRN
  Filled 2020-01-29: qty 5

## 2020-01-29 MED ORDER — EPINEPHRINE PF 1 MG/ML IJ SOLN
INTRAMUSCULAR | Status: AC
  Filled 2020-01-29: qty 1

## 2020-01-29 MED ORDER — MIDAZOLAM HCL 2 MG/2ML IJ SOLN
INTRAMUSCULAR | Status: AC
  Filled 2020-01-29: qty 2

## 2020-01-29 MED ORDER — PROPOFOL 10 MG/ML IV BOLUS
INTRAVENOUS | Status: AC
  Filled 2020-01-29: qty 20

## 2020-01-29 MED ORDER — FENTANYL CITRATE (PF) 250 MCG/5ML IJ SOLN
INTRAMUSCULAR | Status: DC | PRN
  Administered 2020-01-29 (×8): 50 ug via INTRAVENOUS

## 2020-01-29 MED ORDER — ACETAMINOPHEN 650 MG RE SUPP: 650.0000 mg | RECTAL | Status: DC | PRN

## 2020-01-29 MED ORDER — PROPOFOL 10 MG/ML IV BOLUS
INTRAVENOUS | Status: DC | PRN
  Administered 2020-01-29: 200 mg via INTRAVENOUS

## 2020-01-29 MED ORDER — ONDANSETRON HCL 4 MG/2ML IJ SOLN
INTRAMUSCULAR | Status: AC
  Filled 2020-01-29: qty 2

## 2020-01-29 SURGICAL SUPPLY — 115 items
APPLIER CLIP 11 MED OPEN (CLIP) ×8
APPLIER CLIP 13 LRG OPEN (CLIP) ×4
BLADE CLIPPER SURG (BLADE) IMPLANT
BLADE SURG 10 STRL SS (BLADE) ×4 IMPLANT
BONE VIVIGEN FORMABLE 10CC (Bone Implant) ×4 IMPLANT
BUR EGG ELITE 4.0 (BURR) ×3 IMPLANT
BUR EGG ELITE 4.0MM (BURR) ×1
CABLE BIPOLOR RESECTION CORD (MISCELLANEOUS) ×4 IMPLANT
CATH FOLEY 2WAY SLVR  5CC 16FR (CATHETERS) ×2
CATH FOLEY 2WAY SLVR 5CC 16FR (CATHETERS) ×2 IMPLANT
CLIP APPLIE 11 MED OPEN (CLIP) ×4 IMPLANT
CLIP APPLIE 13 LRG OPEN (CLIP) IMPLANT
CLIP LIGATING EXTRA MED SLVR (CLIP) ×4 IMPLANT
CLIP LIGATING EXTRA SM BLUE (MISCELLANEOUS) ×4 IMPLANT
CLIP NEUROVISION LG (CLIP) ×2 IMPLANT
CLOSURE STERI-STRIP 1/2X4 (GAUZE/BANDAGES/DRESSINGS)
CLOSURE WOUND 1/2 X4 (GAUZE/BANDAGES/DRESSINGS) ×1
CLSR STERI-STRIP ANTIMIC 1/2X4 (GAUZE/BANDAGES/DRESSINGS) IMPLANT
COVER SURGICAL LIGHT HANDLE (MISCELLANEOUS) ×4 IMPLANT
COVER WAND RF STERILE (DRAPES) ×8 IMPLANT
DERMABOND ADVANCED (GAUZE/BANDAGES/DRESSINGS) ×2
DERMABOND ADVANCED .7 DNX12 (GAUZE/BANDAGES/DRESSINGS) ×2 IMPLANT
DRAPE C-ARM 42X72 X-RAY (DRAPES) ×8 IMPLANT
DRAPE C-ARMOR (DRAPES) ×4 IMPLANT
DRAPE POUCH INSTRU U-SHP 10X18 (DRAPES) ×4 IMPLANT
DRAPE SURG 17X23 STRL (DRAPES) ×4 IMPLANT
DRAPE U-SHAPE 47X51 STRL (DRAPES) ×4 IMPLANT
DRSG OPSITE POSTOP 3X4 (GAUZE/BANDAGES/DRESSINGS) ×2 IMPLANT
DRSG OPSITE POSTOP 4X6 (GAUZE/BANDAGES/DRESSINGS) ×2 IMPLANT
DRSG OPSITE POSTOP 4X8 (GAUZE/BANDAGES/DRESSINGS) ×4 IMPLANT
DURAPREP 26ML APPLICATOR (WOUND CARE) ×4 IMPLANT
ELECT BLADE 4.0 EZ CLEAN MEGAD (MISCELLANEOUS) ×8
ELECT CAUTERY BLADE 6.4 (BLADE) ×4 IMPLANT
ELECT PENCIL ROCKER SW 15FT (MISCELLANEOUS) ×4 IMPLANT
ELECT REM PT RETURN 9FT ADLT (ELECTROSURGICAL) ×4
ELECTRODE BLDE 4.0 EZ CLN MEGD (MISCELLANEOUS) ×4 IMPLANT
ELECTRODE REM PT RTRN 9FT ADLT (ELECTROSURGICAL) ×2 IMPLANT
GAUZE 4X4 16PLY RFD (DISPOSABLE) IMPLANT
GLOVE BIO SURGEON STRL SZ 6.5 (GLOVE) ×3 IMPLANT
GLOVE BIO SURGEON STRL SZ7.5 (GLOVE) ×4 IMPLANT
GLOVE BIO SURGEONS STRL SZ 6.5 (GLOVE) ×1
GLOVE BIOGEL PI IND STRL 6.5 (GLOVE) ×2 IMPLANT
GLOVE BIOGEL PI IND STRL 8 (GLOVE) ×2 IMPLANT
GLOVE BIOGEL PI IND STRL 8.5 (GLOVE) ×4 IMPLANT
GLOVE BIOGEL PI INDICATOR 6.5 (GLOVE) ×2
GLOVE BIOGEL PI INDICATOR 8 (GLOVE) ×2
GLOVE BIOGEL PI INDICATOR 8.5 (GLOVE) ×4
GLOVE SS BIOGEL STRL SZ 7.5 (GLOVE) ×2 IMPLANT
GLOVE SS BIOGEL STRL SZ 8.5 (GLOVE) ×4 IMPLANT
GLOVE SUPERSENSE BIOGEL SZ 7.5 (GLOVE) ×2
GLOVE SUPERSENSE BIOGEL SZ 8.5 (GLOVE) ×4
GOWN STRL REUS W/ TWL LRG LVL3 (GOWN DISPOSABLE) ×2 IMPLANT
GOWN STRL REUS W/ TWL XL LVL3 (GOWN DISPOSABLE) ×2 IMPLANT
GOWN STRL REUS W/TWL 2XL LVL3 (GOWN DISPOSABLE) ×12 IMPLANT
GOWN STRL REUS W/TWL LRG LVL3 (GOWN DISPOSABLE) ×2
GOWN STRL REUS W/TWL XL LVL3 (GOWN DISPOSABLE) ×2
GRAFT BNE MATRIX VG FRMBL L 10 (Bone Implant) IMPLANT
GUIDEWIRE NITINOL BEVEL TIP (WIRE) ×8 IMPLANT
HEMOSTAT SNOW SURGICEL 2X4 (HEMOSTASIS) IMPLANT
HEMOSTAT SURGICEL 2X14 (HEMOSTASIS) ×4 IMPLANT
INSERT FOGARTY 61MM (MISCELLANEOUS) IMPLANT
INSERT FOGARTY SM (MISCELLANEOUS) IMPLANT
KIT BASIN OR (CUSTOM PROCEDURE TRAY) ×4 IMPLANT
KIT HARVESTER BONE 10 DISP (KITS) ×2 IMPLANT
KIT TURNOVER KIT B (KITS) ×4 IMPLANT
LIGHT SOURCE STRAIGHT TIP (INSTRUMENTS) ×4 IMPLANT
LOOP VESSEL MAXI BLUE (MISCELLANEOUS) IMPLANT
LOOP VESSEL MINI RED (MISCELLANEOUS) IMPLANT
MODULE EMG NDL SSEP NVM5 (NEEDLE) IMPLANT
MODULE EMG NEEDLE SSEP NVM5 (NEEDLE) ×4 IMPLANT
MODULE NVM5 NEXT GEN EMG (NEEDLE) ×2 IMPLANT
NDL SPNL 18GX3.5 QUINCKE PK (NEEDLE) ×2 IMPLANT
NEEDLE SPNL 18GX3.5 QUINCKE PK (NEEDLE) ×4 IMPLANT
NS IRRIG 1000ML POUR BTL (IV SOLUTION) ×4 IMPLANT
PACK LAMINECTOMY ORTHO (CUSTOM PROCEDURE TRAY) ×4 IMPLANT
PACK UNIVERSAL I (CUSTOM PROCEDURE TRAY) ×4 IMPLANT
PAD ARMBOARD 7.5X6 YLW CONV (MISCELLANEOUS) ×16 IMPLANT
PROBE BALL TIP NVM5 SNG USE (BALLOONS) ×4 IMPLANT
ROD RELINE MAS LORD 5.5X50 (Rod) ×4 IMPLANT
SCREW LOCK RELINE 5.5 TULIP (Screw) ×8 IMPLANT
SCREW RELINE RED 6.5X45MM POLY (Screw) ×8 IMPLANT
SPACER OLIF 12X55 6D (Spacer) ×2 IMPLANT
SPONGE INTESTINAL PEANUT (DISPOSABLE) ×12 IMPLANT
SPONGE LAP 18X18 RF (DISPOSABLE) IMPLANT
SPONGE LAP 4X18 RFD (DISPOSABLE) IMPLANT
SPONGE SURGIFOAM ABS GEL 100 (HEMOSTASIS) IMPLANT
STAPLER VISISTAT 35W (STAPLE) IMPLANT
STRIP CLOSURE SKIN 1/2X4 (GAUZE/BANDAGES/DRESSINGS) ×1 IMPLANT
SURGIFLO W/THROMBIN 8M KIT (HEMOSTASIS) ×8 IMPLANT
SUT BONE WAX W31G (SUTURE) ×4 IMPLANT
SUT MNCRL AB 3-0 PS2 18 (SUTURE) ×4 IMPLANT
SUT MON AB 3-0 SH 27 (SUTURE) ×2
SUT MON AB 3-0 SH27 (SUTURE) ×2 IMPLANT
SUT PDS AB 1 CTX 36 (SUTURE) ×4 IMPLANT
SUT PROLENE 4 0 RB 1 (SUTURE)
SUT PROLENE 4-0 RB1 .5 CRCL 36 (SUTURE) IMPLANT
SUT PROLENE 5 0 C 1 24 (SUTURE) IMPLANT
SUT PROLENE 5 0 CC1 (SUTURE) IMPLANT
SUT PROLENE 6 0 C 1 30 (SUTURE) ×4 IMPLANT
SUT PROLENE 6 0 CC (SUTURE) IMPLANT
SUT SILK 0 TIES 10X30 (SUTURE) ×4 IMPLANT
SUT SILK 2 0 TIES 10X30 (SUTURE) ×8 IMPLANT
SUT SILK 2 0SH CR/8 30 (SUTURE) IMPLANT
SUT SILK 3 0 TIES 10X30 (SUTURE) ×4 IMPLANT
SUT SILK 3 0 TIES 17X18 (SUTURE) ×2
SUT SILK 3 0SH CR/8 30 (SUTURE) IMPLANT
SUT SILK 3-0 18XBRD TIE BLK (SUTURE) ×2 IMPLANT
SUT VIC AB 1 CT1 27 (SUTURE) ×4
SUT VIC AB 1 CT1 27XBRD ANBCTR (SUTURE) ×4 IMPLANT
SUT VIC AB 2-0 CT1 18 (SUTURE) ×4 IMPLANT
SYR BULB IRRIGATION 50ML (SYRINGE) ×4 IMPLANT
TOWEL GREEN STERILE (TOWEL DISPOSABLE) ×8 IMPLANT
TOWEL GREEN STERILE FF (TOWEL DISPOSABLE) ×4 IMPLANT
TRAP SPECIMEN MUCOUS 40CC (MISCELLANEOUS) ×4 IMPLANT
WATER STERILE IRR 1000ML POUR (IV SOLUTION) ×4 IMPLANT

## 2020-01-29 NOTE — OR Nursing (Signed)
Attempted to get autograft from iliac; unsuccessful

## 2020-01-29 NOTE — Discharge Instructions (Signed)

## 2020-01-29 NOTE — Anesthesia Procedure Notes (Signed)
Anesthesia Procedure Image    

## 2020-01-29 NOTE — Brief Op Note (Signed)
01/29/2020  2:16 PM  PATIENT:  Timothy Brock  64 y.o. 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 Chestine Spore to do approach Left sided tap block with exparel ABDOMINAL EXPOSURE (N/A)  SURGEON:  Surgeon(s) and Role: Panel 1:    Venita Lick, MD - Primary Panel 2:    Cephus Shelling, MD - Primary  PHYSICIAN ASSISTANT:   ASSISTANTS: Amanda Ward., PA   ANESTHESIA:   general  EBL:  200 mL   BLOOD ADMINISTERED:none  DRAINS: none   LOCAL MEDICATIONS USED:  MARCAINE     SPECIMEN:  No Specimen  DISPOSITION OF SPECIMEN:  N/A  COUNTS:  YES  TOURNIQUET:  * No tourniquets in log *  DICTATION: .Dragon Dictation  PLAN OF CARE: Admit to inpatient   PATIENT DISPOSITION:  PACU - hemodynamically stable.

## 2020-01-29 NOTE — Transfer of Care (Signed)
Immediate Anesthesia Transfer of Care Note  Patient: Timothy Brock  Procedure(s) Performed: Oblique lumbar interbody fusion Lumbar four-five with posterior spinal interbody fusion (N/A Spine Lumbar) ABDOMINAL EXPOSURE (N/A )  Patient Location: PACU  Anesthesia Type:General  Level of Consciousness: awake, alert  and oriented  Airway & Oxygen Therapy: Patient Spontanous Breathing and Patient connected to face mask oxygen  Post-op Assessment: Report given to RN, Post -op Vital signs reviewed and stable and Patient moving all extremities  Post vital signs: Reviewed and stable  Last Vitals:  Vitals Value Taken Time  BP 142/98 01/29/20 1503  Temp    Pulse 114 01/29/20 1509  Resp 26 01/29/20 1509  SpO2 95 % 01/29/20 1509  Vitals shown include unvalidated device data.  Last Pain:  Vitals:   01/29/20 0704  TempSrc: Oral  PainSc:       Patients Stated Pain Goal: 3 (01/29/20 4276)  Complications: No apparent anesthesia complications

## 2020-01-29 NOTE — H&P (Signed)
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  The various methods of treatment have been discussed with the patient and family. After consideration of risks, benefits and other options for treatment, the patient has consented to  Procedure(s) with comments: Oblique lumbar interbody fusion L4-5 with posterior spinal interbody fusion (N/A) - 4.5 hrs Dr. Carlis Abbott to do approach Left sided tap block with exparel ABDOMINAL EXPOSURE (N/A) as a surgical intervention.  The patient's history has been reviewed, patient examined, no change in status, stable for surgery.  I have reviewed the patient's chart and labs.  Questions were answered to the patient's satisfaction.    L4-L5 OLIF exposure.  Risks and benefits previously discussed in detail.  Timothy Brock  Patient name: Timothy Brock MRN: [id number] DOB: 01/01/1956 Sex: male  REASON FOR CONSULT: Evaluation for L4-L5 OLIF  HPI:  Timothy Brock is a 64 y.o. male, with history of hyperlipidemia as well as chronic lower back pain who presents for evaluation of planned L4-L5 OLIF with Dr. Rolena Infante. Patient states he has had chronic lower back pain with radiculopathy down the left leg for 7+ months if not longer. He has failed conservative management including therapy and injections. Dr. Rolena Infante has now recommended L4-L5 OLIF with anterior and posterior approach. His only history of abdominal surgery includes laparoscopic appendectomy. He still works as an Chief Financial Officer. No history of smoking.      Past Medical History:  Diagnosis Date  . Chronic back pain   . Degenerative spondylolisthesis   . Hypercholesterolemia   . Lumbar radiculopathy    PSH: Appendectomy, right shoulder, left shoulder, left wrist       Family History  Problem Relation Age of Onset  . Heart disease Father    SOCIAL HISTORY:  Social History         Socioeconomic History  . Marital status: Married    Spouse name: Not on file  . Number of children: Not on file  . Years of education: Not on file  . Highest education level: Not on file  Occupational History  . Not on file  Tobacco Use  . Smoking status: Never Smoker  . Smokeless tobacco: Never Used  Substance and Sexual Activity  . Alcohol use: Yes  . Drug use: Not on file  . Sexual activity: Not on file  Other Topics Concern  . Not on file  Social History Narrative  . Not on file   Social Determinants of Health      Financial Resource Strain:   . Difficulty of Paying Living Expenses: Not on file  Food Insecurity:   . Worried About Charity fundraiser in the Last Year: Not on file  . Ran Out of Food in the Last Year: Not on file  Transportation Needs:   . Lack of Transportation (Medical): Not on file  . Lack of Transportation (Non-Medical): Not on file  Physical Activity:   . Days of Exercise per Week: Not on file  . Minutes of Exercise per Session: Not on file  Stress:   . Feeling of Stress : Not on file  Social Connections:   . Frequency of Communication with Friends and Family: Not on file  . Frequency of Social Gatherings with Friends and Family: Not on file  . Attends Religious Services: Not on file  . Active Member of Clubs or  Organizations: Not on file  . Attends Banker Meetings: Not on file  . Marital Status: Not on file  Intimate Partner Violence:   . Fear of Current or Ex-Partner: Not on file  . Emotionally Abused: Not on file  . Physically Abused: Not on file  . Sexually Abused: Not on file   No Known Allergies        Current Outpatient Medications  Medication Sig Dispense Refill  . aspirin 81 MG EC tablet Take by mouth.    . meloxicam (MOBIC) 15 MG tablet meloxicam 15 mg tablet  Take 1 tablet by mouth once daily    . Multiple Vitamin (MULTIVITAMIN) capsule Take by mouth.    . rosuvastatin (CRESTOR) 10 MG tablet Take by mouth.     . rosuvastatin (CRESTOR) 20 MG tablet Take by mouth.    . traMADol (ULTRAM) 50 MG tablet tramadol 50 mg tablet  TAKE 1 TABLET BY MOUTH THREE TIMES DAILY AS NEEDED FOR 21 DAYS     No current facility-administered medications for this visit.   REVIEW OF SYSTEMS:  [X]  denotes positive finding, [ ]  denotes negative finding  Cardiac  Comments:  Chest pain or chest pressure:    Shortness of breath upon exertion:    Short of breath when lying flat:    Irregular heart rhythm:        Vascular    Pain in calf, thigh, or hip brought on by ambulation:    Pain in feet at night that wakes you up from your sleep:     Blood clot in your veins:    Leg swelling:         Pulmonary    Oxygen at home:    Productive cough:     Wheezing:         Neurologic    Sudden weakness in arms or legs:     Sudden numbness in arms or legs:     Sudden onset of difficulty speaking or slurred speech:    Temporary loss of vision in one eye:     Problems with dizziness:     Back pain x   Gastrointestinal    Blood in stool:     Vomited blood:         Genitourinary    Burning when urinating:     Blood in urine:        Psychiatric    Major depression:         Hematologic    Bleeding problems:    Problems with blood clotting too easily:        Skin    Rashes or ulcers:        Constitutional    Fever or chills:    PHYSICAL EXAM:     Vitals:   01/14/20 1453  BP: (!) 149/89  Pulse: 91  Resp: 16  Temp: 97.9 F (36.6 C)  TempSrc: Temporal  SpO2: 94%  Weight: 268 lb (121.6 kg)  Height: 5\' 10"  (1.778 m)   GENERAL: The patient is a well-nourished male, in no acute distress. The vital signs are documented above.  CARDIAC: There is a regular rate and rhythm.  VASCULAR:  Palpable femoral pulses bilaterally  Palpable dorsalis pedis pulse bilaterally  PULMONARY: There is good air exchange bilaterally without wheezing or rales.  ABDOMEN: Soft and non-tender with normal pitched bowel sounds.   MUSCULOSKELETAL: There are no major deformities or cyanosis.  NEUROLOGIC: No focal weakness or paresthesias are detected.  SKIN: There are no ulcers or rashes noted.  PSYCHIATRIC: The patient has a normal affect.  DATA:  I independently reviewed his MR lumbar spine and his iliac vein bifurcation appears to be approximate L4, aortic bifurcation closer to L3.  Assessment/Plan:  64 year old male with history of chronic lower back pain with left lower leg radiculopathy that presents for preop evaluation of planned L4-L5 OLIF with Dr. Shon Baton. I have reviewed his MRI imaging and think he would be a good candidate for OLIF. I discussed the steps of the operation in detail with the patient and his wife including lateral decubitus positioning with incision over left oblique muscles. We discussed risks and benefits including risk of injury to the left iliac artery or vein as well as injury to the intestines or left ureter that all require mobliziation. Look forward to assisting Dr. Shon Baton. Questions and concerns answered.  Cephus Shelling, MD  [facility]  Office: [phone]

## 2020-01-29 NOTE — H&P (Signed)
Addendum H&P There has been no change in the patient's clinical exam since his last office visit of 01/20/2020.  Patient presents today because of ongoing significant back buttock and radicular leg pain.  Imaging studies demonstrated degenerative grade 1 spondylolisthesis at L4-5 with severe bilateral facet arthrosis and lateral recess and foraminal narrowing.  As result of the failure of conservative management consisting of physical and injection therapy, and the ongoing regular/neuropathic leg pain we have elected to move forward with surgery.  I have discussed the risks and benefits of the oblique lumbar interbody fusion with posterior supplemental pedicle screw fixation with the patient and all of his questions were encouraged and addressed.

## 2020-01-29 NOTE — Op Note (Addendum)
Operative report  Preoperative diagnosis: Degenerative spondylolisthesis L4-5 with radicular left leg pain  Postoperative diagnosis: Same  Operative procedure: Oblique lumbar interbody fusion L4-5.  Posterior pedicle screw fusion L4-5.  Approach surgeon: Dr. Clotilde Dieter  First assistant: Marchelle Folks Ward  Complications: Attempted to harvest iliac crest for bone graft for intervertebral cage.  Despite multiple attempts due to his body habitus I was unable to place the bone harvesting device properly to harvest graft.  As a result we elected to move forward with using vivogen.  Implants: Medtronic pivot intervertebral cage.  12 x 55.  NuVasive MIS pedicle screws: 6.5 x 45 with 50 mm rods.  EBL: 200  Cell Saver: 0  Neuro monitoring: All 4 pedicle screws were directly stimulated with no adverse activity at greater than 40 mA.  No abnormal EMG/SSEP activity throughout the case.  Indications: Timothy Brock is a very pleasant 64 year old obese gentleman with significant back buttock and radicular leg pain.  Attempts at conservative management had failed to alleviate his symptoms and his overall quality of life is continued to deteriorate.  As a result of the failure, we elected to move forward with surgery.  All appropriate risks benefits and alternatives were discussed with the patient and consent was obtained.  Operative report patient was brought the operating room placed on the operating room table.  After successful induction of general anesthesia and endotracheal the patient teds SCDs and a Foley were inserted.  The neuro monitoring rep then attached all appropriate monitoring devices for intraoperative EMG and SSEP monitoring.  The patient was then turned into the lateral decubitus position left side up.  Axillary roll was placed and the knee and ankle were well-padded.  Using fluoroscopy we properly position the patient so we could clearly identify the L4-5 disc space level.  The patient was secured to  the table with tape to prevent any abnormal motion during surgery.  Timeout was taken to confirm patient procedure and all other important data.  At this point time Dr. Chestine Spore performed a standard oblique retroperitoneal approach to the L4-5 level.  Please refer to his dictation for specifics.  Once he had the retractor properly positioned and the L4-5 level exposed  Dr. Chestine Spore scrubbed out.  A needle was placed into the L4-5 disc space and intraoperative x-ray confirmed I was at the appropriate level.  Anterior lateral annulotomy was performed with a 10 blade scalpel and I remove the annulus and the bulk of the disc material pituitary rongeurs.  I then advanced a Cobb elevator along the endplates of L4 and L5 using fluoroscopy guidance.  I then released the contralateral annulus.  I then used a box osteotomes to remove the remaining portion of disc material.  Rasps, curettes, and pituitary rongeurs were utilized to remove the remaining portion of disc.  I then made sure I had bleeding subchondral bone.  I then used trial devices and elected to use the size 12 x 55.  This provided the best overall fit.  The peek intervertebral cage was obtained.  At this point a second small incision was made over the lateral aspect of the iliac crest.  I then dissected down to the lateral aspect of the iliac crest.  I was able to palpate the inner and outer table and I placed the bone harvesting device on the iliac crest.  I then began to try to harvest bone.  However because of his body habitus I could not lower my hand adequately in order to stay  within the cancellous portion of the iliac crest.  After multiple attempts I grew increasingly concerned that I would breached the inner table and so I elected to obtain an allograft substitute.  I then obtained the allograft and placed it into the peek intervertebral cage.  The cage was then [facility] to the appropriate depth and I confirmed satisfactory position in both the AP and  lateral planes.  Once the cage was properly seated I then remove the inserting device and irrigated wound copiously normal saline.  Using proper electrocautery I confirmed hemostasis and then placed FloSeal to aid in maintaining hemostasis.  At this point I then removed all the retractors sequentially.  The fascia of the external oblique was then reapproximated with a running #1 PDS suture.  The small iliac crest bone graft wound was also irrigated and closed in a layered fashion with interrupted #1 Vicryl suture, 2-0 Vicryl suture, and 3-0 Monocryl.  The remaining portion of the oblique incision was closed with a layer of 2-0 Vicryl suture, and 3-0 Monocryl.  Steri-Strips dry dressings were applied.  With the patient maintained in the lateral position I then went and identified the L4 and L5 pedicles.  I then marked out my incision site and made a small incision and advanced the Jamshidi needle percutaneously to the lateral aspect of the L4 pedicle.  Once I confirmed satisfactory position in the AP plane I connected the Jamshidi needle to the neuro monitoring device and using fluoroscopic guidance and neuro monitoring I advanced the Jamshidi needle into the L4 pedicle.  As I neared the medial wall of the apical on the AP view I switched to the lateral view to confirm that I was beyond the posterior wall of the vertebral body once this was confirmed I advanced into the vertebral body and then placed the guidepin to cannulate the pedicle.  Using the same exact technique I cannulated the L5 pedicle and the contralateral L4 and L5 pedicles.  Once all 4 pedicles were cannulated I then measured and placed the pedicle screws.  6.5 x 45 mm lag screw was placed over the guidepin and advanced into the pedicle.  Once all 4 pedicle screws were properly positioned I directly stimulated each of the there was no adverse activity at greater than 40 mA.  At this point I measured and then placed the rod.  I confirmed that I  spanned the L4-L5 level and then placed the locking cap.  Once both rods were in place all 4 locking caps were secured and then torqued to final tightening.  This was done according manufacture standards.  The inserting labs for the pedicle screws were then removed.  Final x-rays were taken in both the AP and lateral planes confirming adequate placement of the pedicle screw fixation in the intervertebral cage.  At this point time the 4 posterior percutaneous incisions were irrigated and closed in a layered fashion with interrupted #1 Vicryl suture, 2-0 Vicryl suture, and 3-0 Monocryl stitches.  Steri-Strips dry dressings were applied.  Patient was ultimately extubated transfer the PACU without incident.  The end of the case all needle sponge counts were correct.  There were no adverse intraoperative events.     Addendum: It should be noted that vivigen is an allograft bone graft utilized to facilitate the fusion.  I was unable to harvest the iliac crest bone graft due to technical issues.  Without a bone graft there would be no fusion.  The CPT for this is 20930.

## 2020-01-29 NOTE — Anesthesia Procedure Notes (Signed)
Procedure Name: Intubation Date/Time: 01/29/2020 8:43 AM Performed by: Amadeo Garnet, CRNA Pre-anesthesia Checklist: Patient identified, Emergency Drugs available, Suction available and Patient being monitored Patient Re-evaluated:Patient Re-evaluated prior to induction Oxygen Delivery Method: Circle system utilized Preoxygenation: Pre-oxygenation with 100% oxygen Induction Type: IV induction Ventilation: Mask ventilation without difficulty and Oral airway inserted - appropriate to patient size Laryngoscope Size: Mac and 4 Grade View: Grade II Tube type: Oral Tube size: 7.5 mm Number of attempts: 1 Airway Equipment and Method: Stylet Placement Confirmation: ETT inserted through vocal cords under direct vision,  positive ETCO2 and breath sounds checked- equal and bilateral Secured at: 22 cm Tube secured with: Tape Dental Injury: Teeth and Oropharynx as per pre-operative assessment

## 2020-01-29 NOTE — Op Note (Signed)
Date: January 29, 2020  Preoperative diagnosis: Chronic lower back pain with degenerative spondylolisthesis at L4-L5  Postoperative diagnosis: Same  Procedure: 1.  Spine exposure at the L4-L5 disc space for oblique lumbar interbody fusion  Surgeon: Dr. Cephus Shelling, MD  Co-surgeon: Dr. Venita Lick, MD  Indications: Patient is a 64 year old male that presented with significant lower back pain with radiculopathy.  Ultimately was found to have degenerative spondylolisthesis at L4-L5 and presents today for planned L4-L5 OLIF with Dr. Shon Baton.  Vascular surgery was asked to assist with spine exposure at the L4-L5 disc space.  He presents after risk benefits were discussed.  Findings: Incision over the left oblique muscles in the right lateral decubitus position with subsequent exposure of the anterior lateral L4-L5 disc space that was confirmed with spinal needle under fluoroscopy.  This did require ligation of one iliolumbar branch.  Details: Patient was taken to the operating room after informed consent was obtained.  He was initially intubated with general endotracheal anesthesia.  He was then positioned in the right lateral decubitus position by Dr. Shon Baton and all pressure points were padded accordingly.  Once he was secured to the table we then used fluoroscopy in both the anterior/posterior and lateral positions to identify the L4-L5 disc space that was then marked on the left abdominal wall over the oblique muscles.  The abdomen as well as the left flank and back were then prepped and draped in usual sterile fashion by the orthopedic surgery team.  Ultimately a timeout was performed and preoperative antibiotics were administered.  Initially made a oblique incision over the left oblique muscles with 15 blade scalpel where we had previously marked the disc spase.  Dissected down with Bovie cautery and opened the subcutaneous tissue and subsequently the fascia over the external obliques.   Ultimately the obliques were then opened in a muscle-sparing technique until we identified the peritoneum.  At that point time we used a blind finger sweep technique to mobilize the peritoneum and intestines off the abdominal wall as well as out of the retroperitoneum and this obviously was swept down given that the patient was in the right lateral decubitus position.  Ultimately the left iliac vessels as well as the left psoas were identified.  My assistant used a long Wiley retractors for adequate visualization.  Ultimately I used suction with laparoscopic dissector to ultimately bluntly mobilize the left psoas laterally to fully expose the L4-L5 disc space.  We subsequently placed a fixed retractor from [employer] that was then secured to the bed.  We then used several long arms on the fixed retractor to retract psoas laterally and mobilize the vessels medially.  At that point in time I did insert a needle in the spine disc and evaluated under fluoroscopy to confirm we were at the L4-L5 disc space.  We were at the correct level.  Further mobilization was required bluntly to fully expose the disc space.  It became apparent that we had a large iliolumbar vein crossing at the disc space and ultimately I was able to get a long right angle retractor around this and used 2-0 silk ties and large vessel clips to tie this off and that was divided.  We then repositioned the retractors in order to fully expose the L4-L5 disc space.  At that point in time I turned the case over to Dr. Shon Baton.  Please see his dictation for the remaining portion of the case.  Complications: None  Condition: Stable  Canary Brim.  Carlis Abbott, MD [facility] Office: [place]

## 2020-01-29 NOTE — Anesthesia Procedure Notes (Signed)
Anesthesia Regional Block: TAP block   Pre-Anesthetic Checklist: ,, timeout performed, Correct Patient, Correct Site, Correct Laterality, Correct Procedure, Correct Position, site marked, Risks and benefits discussed,  Surgical consent,  Pre-op evaluation,  At surgeon's request and post-op pain management  Laterality: Left  Prep: chloraprep       Needles:  Injection technique: Single-shot  Needle Type: Echogenic Needle     Needle Length: 9cm      Additional Needles:   Procedures:,,,, ultrasound used (permanent image in chart),,,,  Narrative:  Start time: 01/29/2020 8:40 AM End time: 01/29/2020 8:46 AM Injection made incrementally with aspirations every 5 mL.  Performed by: Personally  Anesthesiologist: Eilene Ghazi, MD  Additional Notes: Patient tolerated the procedure well without complications

## 2020-01-30 MED ORDER — CHLORPROMAZINE HCL 25 MG PO TABS
25.0000 mg | ORAL_TABLET | Freq: Three times a day (TID) | ORAL | Status: DC | PRN
  Filled 2020-01-30: qty 1

## 2020-01-30 NOTE — Progress Notes (Signed)
    Subjective: Procedure(s) (LRB): Oblique lumbar interbody fusion Lumbar four-five with posterior spinal interbody fusion (N/A) ABDOMINAL EXPOSURE (N/A) 1 Day Post-Op  Patient reports pain as 2 on 0-10 scale.  Reports none leg pain reports incisional back pain  - minor Positive void Negative bowel movement Positive flatus Negative chest pain or shortness of breath  Objective: Vital signs in last 24 hours: Temp:  [97.8 F (36.6 C)-98.5 F (36.9 C)] 97.8 F (36.6 C) (02/18 0740) Pulse Rate:  [90-122] 99 (02/18 0740) Resp:  [12-20] 20 (02/18 0740) BP: (124-176)/(72-98) 130/77 (02/18 0740) SpO2:  [91 %-97 %] 93 % (02/18 0740)  Intake/Output from previous day: 02/17 0701 - 02/18 0700 In: 2850 [I.V.:2600; IV Piggyback:250] Out: 1925 [Urine:1725; Blood:200]  Labs: Recent Labs    01/27/20 0839  WBC 4.6  RBC 5.21  HCT 49.8  PLT 203   Recent Labs    01/27/20 0839  NA 142  K 4.1  CL 107  CO2 22  BUN 18  CREATININE 0.89  GLUCOSE 109*  CALCIUM 9.2   Recent Labs    01/27/20 0839  INR 1.0    Physical Exam: Neurologically intact ABD soft Intact pulses distally Incision: dressing C/D/I and no drainage Compartment soft Body mass index is 38.1 kg/m.   Assessment/Plan: Patient stable  xrays n/a Continue mobilization with physical therapy Continue care  Advance diet Up with therapy  1. Patient has done very well s/p L4/5 fusion 2. Pain well controlled with oral medications 3. Ambulating without radicular leg pain 4. Dressing c/d/i 5. plan on d/c to home later today - f/u in 2 weeks 6. Thorazine ordered for his hiccups.   Venita Lick, MD [facility] [phone]

## 2020-01-30 NOTE — Evaluation (Signed)
Occupational Therapy Evaluation Patient Details Name: Timothy Brock MRN: [id number] DOB: 09-Nov-1956 Today's Date: 01/30/2020    History of Present Illness 64 y/o male s/p OLIF L4-5 w/ PSFI. PMH includes DDD, Lumbar radiculopathy, hyperlipidemia.   Clinical Impression   PTA pt living with spouse, independent for BADL/IADL. At time of eval, pt is able to complete mobility at supervision level of assist. He requires min cueing to maintain precautions, but is able to self correct. He can be mildly impulsive unless otherwise cued (tried to get dressed in a rush in standing while hopping on one foot, cued to sit for safety). Back handout provided and reviewed adls in detail. Pt educated on: clothing between brace, never sleep in brace, avoid sitting for long periods of time, correct bed positioning for sleeping, correct sequence for bed mobility, avoiding lifting more than 5 pounds and never wash directly over incision. All education is complete and patient indicates understanding. Pt has needed DME at home including: HH shower, shower seat, lift chair, and bidet. No further OT needs or follow up needed. OT will sign off, thank you for this consult.    Follow Up Recommendations  No OT follow up    Equipment Recommendations  None recommended by OT    Recommendations for Other Services       Precautions / Restrictions Precautions Precautions: Back Precaution Booklet Issued: Yes (comment) Precaution Comments: reviewed precautions in implication with BADL Required Braces or Orthoses: Spinal Brace Spinal Brace: Lumbar corset;Applied in sitting position Restrictions Weight Bearing Restrictions: No Other Position/Activity Restrictions: aaply brace in sitting, may remove to shower and walk to bathroom      Mobility Bed Mobility               General bed mobility comments: standing up in room on OT arrival  Transfers Overall transfer level: Needs assistance Equipment used:  None Transfers: Sit to/from Stand Sit to Stand: Supervision         General transfer comment: supervision for safety and cueing technique    Balance Overall balance assessment: No apparent balance deficits (not formally assessed)                                         ADL either performed or assessed with clinical judgement   ADL                                         General ADL Comments: pt able to complete BADL at mod I level this date. Pt practiced LB dressing and brace application. Cues needed to sit while dressing for optimal safety. Reviewed shower and toilet transfer, pt with appropriate DME at home     Vision Baseline Vision/History: Wears glasses Wears Glasses: At all times Patient Visual Report: No change from baseline       Perception     Praxis      Pertinent Vitals/Pain Pain Assessment: Faces Faces Pain Scale: Hurts a little bit Pain Location: L4-5 sx site Pain Descriptors / Indicators: Aching Pain Intervention(s): Monitored during session     Hand Dominance     Extremity/Trunk Assessment Upper Extremity Assessment Upper Extremity Assessment: Overall WFL for tasks assessed   Lower Extremity Assessment Lower Extremity Assessment: Defer to PT evaluation       Communication  Communication Communication: No difficulties   Cognition Arousal/Alertness: Awake/alert Behavior During Therapy: WFL for tasks assessed/performed Overall Cognitive Status: Within Functional Limits for tasks assessed                                 General Comments: was mildly impulsive unless otherwise cued, was able to self correct   General Comments       Exercises     Shoulder Instructions      Home Living Family/patient expects to be discharged to:: Private residence Living Arrangements: Spouse/significant other Available Help at Discharge: Family;Available 24 hours/day Type of Home: House Home Access: Stairs  to enter [employer] of Steps: "a few"   Home Layout: One level     Bathroom Shower/Tub: Producer, television/film/video: Standard Bathroom Accessibility: Yes How Accessible: Accessible via walker Home Equipment: Hand held shower head;Shower seat;Adaptive equipment Adaptive Equipment: Reacher Additional Comments: pt also has bidet installed, lift chair, and a dressing chair in bedroom to sit while dressing      Prior Functioning/Environment Level of Independence: Independent                 OT Problem List: Decreased knowledge of use of DME or AE;Decreased knowledge of precautions;Decreased activity tolerance      OT Treatment/Interventions:      OT Goals(Current goals can be found in the care plan section) Acute Rehab OT Goals Patient Stated Goal: go home today OT Goal Formulation: With patient Time For Goal Achievement: 02/13/20 Potential to Achieve Goals: Good  OT Frequency:     Barriers to D/C:            Co-evaluation              AM-PAC OT "6 Clicks" Daily Activity     Outcome Measure Help from another person eating meals?: None Help from another person taking care of personal grooming?: None Help from another person toileting, which includes using toliet, bedpan, or urinal?: None Help from another person bathing (including washing, rinsing, drying)?: None Help from another person to put on and taking off regular upper body clothing?: None Help from another person to put on and taking off regular lower body clothing?: None 6 Click Score: 24   End of Session Equipment Utilized During Treatment: Back brace Nurse Communication: Mobility status;Precautions  Activity Tolerance: Patient tolerated treatment well Patient left: in bed;Other (comment)(sitting EOB speaking with PA)  OT Visit Diagnosis: Other abnormalities of gait and mobility (R26.89);Pain Pain - part of body: (back)                Time: 5465-6812 OT Time Calculation (min):  12 min Charges:  OT General Charges $OT Visit: 1 Visit OT Evaluation $OT Eval Low Complexity: 1 Low  Dalphine Handing, MSOT, OTR/L Acute Rehabilitation Services [facility] Office Number: [phone]  Dalphine Handing 01/30/2020, 9:33 AM

## 2020-01-30 NOTE — Progress Notes (Addendum)
  Progress Note    01/30/2020 9:20 AM 1 Day Post-Op  Subjective:Says very minimal pain. Just returned to room after being up ambulating with OT in the halls.  A little discomfort on sitting. No incisional pain. No other complaints at this time   Vitals:   01/30/20 0431 01/30/20 0740  BP: 134/80 130/77  Pulse: 90 99  Resp: 20 20  Temp: 98.4 F (36.9 C) 97.8 F (36.6 C)  SpO2: 92% 93%   Physical Exam: General: Well nourished, in no acute pain, sitting up in chair Lungs:  Non labored, equal expansion Incisions:  Oblique and lumbar spine incisions clean, dry, intact. Very minimal tenderness. No swelling. Very minimal ecchymosis Extremities:  2+ pulses bilaterally, no focal weakness or numbness Abdomen:  Distended, soft, non tender Neurologic: Alert and oriented  CBC    Component Value Date/Time   WBC 4.6 01/27/2020 0839   RBC 5.21 01/27/2020 0839   HGB 16.6 01/27/2020 0839   HCT 49.8 01/27/2020 0839   PLT 203 01/27/2020 0839   MCV 95.6 01/27/2020 0839   MCH 31.9 01/27/2020 0839   MCHC 33.3 01/27/2020 0839   RDW 12.7 01/27/2020 0839    BMET    Component Value Date/Time   NA 142 01/27/2020 0839   K 4.1 01/27/2020 0839   CL 107 01/27/2020 0839   CO2 22 01/27/2020 0839   GLUCOSE 109 (H) 01/27/2020 0839   BUN 18 01/27/2020 0839   CREATININE 0.89 01/27/2020 0839   CALCIUM 9.2 01/27/2020 0839   GFRNONAA >60 01/27/2020 0839   GFRAA >60 01/27/2020 0839    INR    Component Value Date/Time   INR 1.0 01/27/2020 0839     Intake/Output Summary (Last 24 hours) at 01/30/2020 0920 Last data filed at 01/30/2020 0406 Gross per 24 hour  Intake 2800 ml  Output 1925 ml  Net 875 ml     Assessment/Plan:  64 y.o. male is s/p spine exposure at the L4-L5 disc space for oblique lumbar interbody fusion 1 Day Post-Op. Up ambulating with PT/OT last night and this morning. Doing well post operatively. Incisions are clean, dry and intact. He will discharge home today. Follow up as  needed if he has any concerns with his incisions    Graceann Congress, PA-C [facility] [phone] 01/30/2020 9:20 AM   I have seen and evaluated the patient. I agree with the PA note as documented above. Looks great POD#1 s/p L4-L5 OLIF.  Plan for discharge home today.    Cephus Shelling, MD [facility] [place] Office: [phone]

## 2020-01-30 NOTE — Evaluation (Signed)
Physical Therapy Evaluation and Discharge Patient Details Name: Timothy Brock MRN: [id number] DOB: Dec 19, 1955 Today's Date: 01/30/2020   History of Present Illness  64 y/o male s/p OLIF L4-5 w/ PSFI. PMH includes DDD, Lumbar radiculopathy, hyperlipidemia.  Clinical Impression  Patient evaluated by Physical Therapy with no further acute PT needs identified. All education has been completed and the patient has no further questions. Pt was able to demonstrate transfers and ambulation with gross modified independence and no AD. Pt was educated on precautions, brace application/wearing schedule, appropriate activity progression, and car transfer. See below for any follow-up Physical Therapy or equipment needs. PT is signing off. Thank you for this referral.     Follow Up Recommendations No PT follow up;Supervision - Intermittent    Equipment Recommendations  None recommended by PT    Recommendations for Other Services       Precautions / Restrictions Precautions Precautions: Back Precaution Booklet Issued: Yes (comment) Precaution Comments: Reviewed precautions during functional mobility.  Required Braces or Orthoses: Spinal Brace Spinal Brace: Lumbar corset;Applied in sitting position Restrictions Weight Bearing Restrictions: No Other Position/Activity Restrictions: aaply brace in sitting, may remove to shower and walk to bathroom      Mobility  Bed Mobility               General bed mobility comments: standing up in room on PT arrival. Verbally reviewed log roll technique.   Transfers Overall transfer level: Modified independent Equipment used: None Transfers: Sit to/from Stand Sit to Stand: Supervision         General transfer comment: No assist required. Pt demonstrated proper hand placement on seated surface for safety as he initiated stand>sit  Ambulation/Gait Ambulation/Gait assistance: Modified independent (Device/Increase time) Gait Distance (Feet): 400  Feet Assistive device: None Gait Pattern/deviations: Step-through pattern;Decreased stride length;Trunk flexed Gait velocity: WFL Gait velocity interpretation: >4.37 ft/sec, indicative of normal walking speed General Gait Details: Pt ambulated well in hall with good gait speed and no unsteadiness or LOB noted.   Stairs            Wheelchair Mobility    Modified Rankin (Stroke Patients Only)       Balance Overall balance assessment: No apparent balance deficits (not formally assessed)                                           Pertinent Vitals/Pain Pain Assessment: Faces Faces Pain Scale: Hurts a little bit Pain Location: L4-5 sx site Pain Descriptors / Indicators: Aching Pain Intervention(s): Limited activity within patient's tolerance;Monitored during session;Repositioned    Home Living Family/patient expects to be discharged to:: Private residence Living Arrangements: Spouse/significant other Available Help at Discharge: Family;Available 24 hours/day Type of Home: House Home Access: Stairs to enter;Ramped entrance   Entrance Stairs-Number of Steps: 2 Home Layout: One level Home Equipment: Hand held shower head;Shower seat;Adaptive equipment Additional Comments: pt also has bidet installed, lift chair, and a dressing chair in bedroom to sit while dressing    Prior Function Level of Independence: Independent               Hand Dominance        Extremity/Trunk Assessment   Upper Extremity Assessment Upper Extremity Assessment: Overall WFL for tasks assessed    Lower Extremity Assessment Lower Extremity Assessment: Overall WFL for tasks assessed    Cervical / Trunk Assessment Cervical / Trunk  Assessment: Other exceptions Cervical / Trunk Exceptions: s/p surgery  Communication   Communication: No difficulties  Cognition Arousal/Alertness: Awake/alert Behavior During Therapy: WFL for tasks assessed/performed Overall Cognitive  Status: Within Functional Limits for tasks assessed                                 General Comments: was mildly impulsive unless otherwise cued, was able to self correct      General Comments      Exercises     Assessment/Plan    PT Assessment Patent does not need any further PT services  PT Problem List         PT Treatment Interventions      PT Goals (Current goals can be found in the Care Plan section)  Acute Rehab PT Goals Patient Stated Goal: go home today PT Goal Formulation: All assessment and education complete, DC therapy    Frequency     Barriers to discharge        Co-evaluation               AM-PAC PT "6 Clicks" Mobility  Outcome Measure Help needed turning from your back to your side while in a flat bed without using bedrails?: None Help needed moving from lying on your back to sitting on the side of a flat bed without using bedrails?: None Help needed moving to and from a bed to a chair (including a wheelchair)?: None Help needed standing up from a chair using your arms (e.g., wheelchair or bedside chair)?: None Help needed to walk in hospital room?: None Help needed climbing 3-5 steps with a railing? : A Little 6 Click Score: 23    End of Session Equipment Utilized During Treatment: Back brace Activity Tolerance: Patient tolerated treatment well Patient left: with call bell/phone within reach(Sitting EOB awaiting OT) Nurse Communication: Mobility status PT Visit Diagnosis: Pain;Other symptoms and signs involving the nervous system (R29.898) Pain - part of body: (back)    Time: 3818-2993 PT Time Calculation (min) (ACUTE ONLY): 12 min   Charges:   PT Evaluation $PT Eval Low Complexity: 1 Low          Conni Slipper, PT, DPT Acute Rehabilitation Services Pager: [phone] Office: [phone]   Marylynn Pearson 01/30/2020, 10:54 AM

## 2020-01-31 ENCOUNTER — Encounter: Payer: Self-pay | Admitting: *Deleted

## 2020-01-31 NOTE — Anesthesia Postprocedure Evaluation (Signed)
Anesthesia Post Note  Patient: Timothy Brock  Procedure(s) Performed: Oblique lumbar interbody fusion Lumbar four-five with posterior spinal interbody fusion (N/A Spine Lumbar) ABDOMINAL EXPOSURE (N/A )     Patient location during evaluation: PACU Anesthesia Type: General Level of consciousness: awake and alert Pain management: pain level controlled Vital Signs Assessment: post-procedure vital signs reviewed and stable Respiratory status: spontaneous breathing, nonlabored ventilation, respiratory function stable and patient connected to nasal cannula oxygen Cardiovascular status: blood pressure returned to baseline and stable Postop Assessment: no apparent nausea or vomiting Anesthetic complications: no    Last Vitals:  Vitals:   01/30/20 0431 01/30/20 0740  BP: 134/80 130/77  Pulse: 90 99  Resp: 20 20  Temp: 36.9 C 36.6 C  SpO2: 92% 93%    Last Pain:  Vitals:   01/30/20 0740  TempSrc: Oral  PainSc: 0-No pain                 Robert Sunga S

## 2020-02-01 NOTE — Discharge Summary (Signed)
Patient ID: Timothy Brock MRN: [id number] DOB/AGE: [birth year] 63 y.o.  Admit date: 01/29/2020 Discharge date: 02/01/2020  Admission Diagnoses:  Active Problems:   Fusion of lumbar spine   Discharge Diagnoses:  Active Problems:   Fusion of lumbar spine  status post Procedure(s): Oblique lumbar interbody fusion Lumbar four-five with posterior spinal interbody fusion ABDOMINAL EXPOSURE  Past Medical History:  Diagnosis Date  . Chronic back pain   . Degenerative spondylolisthesis   . Dysrhythmia    per patient about 23-30 years ago "heart would skip a beat was on cardizem for it"  . Hypercholesterolemia   . Lumbar radiculopathy   . Sleep apnea    per patient diagnosed "about 20 years ago, has never worn a CPAP machine" Care Everywhere shows recent sleep study done in 2019    Surgeries: Procedure(s): Oblique lumbar interbody fusion Lumbar four-five with posterior spinal interbody fusion ABDOMINAL EXPOSURE on 01/29/2020   Consultants: Treatment Team:  Cephus Shelling, MD  Discharged Condition: Improved  Hospital Course: Timothy Brock is an 64 y.o. male who was admitted 01/29/2020 for operative treatment of Degenerative spondylolisthesis L4-5 with radicular left leg pain. Patient failed conservative treatments (please see the history and physical for the specifics) and had severe unremitting pain that affects sleep, daily activities and work/hobbies. After pre-op clearance, the patient was taken to the operating room on 01/29/2020 and underwent  Procedure(s): Oblique lumbar interbody fusion Lumbar four-five with posterior spinal interbody fusion ABDOMINAL EXPOSURE.    Patient was given perioperative antibiotics:  Anti-infectives (From admission, onward)   Start     Dose/Rate Route Frequency Ordered Stop   01/29/20 2100  ceFAZolin (ANCEF) IVPB 1 g/50 mL premix     1 g 100 mL/hr over 30 Minutes Intravenous Every 8 hours 01/29/20 1658 01/30/20 0436   01/29/20 0730  ceFAZolin  (ANCEF) 3 g in dextrose 5 % 50 mL IVPB     3 g 100 mL/hr over 30 Minutes Intravenous To [facility] 01/28/20 1147 01/29/20 1304       Patient was given sequential compression devices and early ambulation to prevent DVT.   Patient benefited maximally from hospital stay and there were no complications. At the time of discharge, the patient was urinating/moving their bowels without difficulty, tolerating a regular diet, pain is controlled with oral pain medications and they have been cleared by PT/OT.   Recent vital signs: No data found.   Recent laboratory studies: No results for input(s): WBC, HGB, HCT, PLT, NA, K, CL, CO2, BUN, CREATININE, GLUCOSE, INR, CALCIUM in the last 72 hours.  Invalid input(s): PT, 2   Discharge Medications:   Allergies as of 01/30/2020   No Known Allergies     Medication List    STOP taking these medications   aspirin 81 MG EC tablet   meloxicam 15 MG tablet Commonly known as: MOBIC   multivitamin capsule   traMADol 50 MG tablet Commonly known as: ULTRAM     TAKE these medications   methocarbamol 500 MG tablet Commonly known as: Robaxin Take 1 tablet (500 mg total) by mouth every 8 (eight) hours as needed for up to 5 days for muscle spasms.   ondansetron 4 MG tablet Commonly known as: Zofran Take 1 tablet (4 mg total) by mouth every 8 (eight) hours as needed for nausea or vomiting.   oxyCODONE-acetaminophen 10-325 MG tablet Commonly known as: Percocet Take 1 tablet by mouth every 6 (six) hours as needed for up to 5 days for  pain.   rosuvastatin 20 MG tablet Commonly known as: CRESTOR Take 20 mg by mouth daily.       Diagnostic Studies: DG Chest 2 View  Result Date: 01/27/2020 CLINICAL DATA:  Preop evaluation for upcoming lumbar surgery EXAM: CHEST - 2 VIEW COMPARISON:  None. FINDINGS: The heart size and mediastinal contours are within normal limits. Both lungs are clear. The visualized skeletal structures are unremarkable.  IMPRESSION: No active cardiopulmonary disease. Electronically Signed   By: Inez Catalina M.D.   On: 01/27/2020 08:57   DG Lumbar Spine 2-3 Views  Result Date: 01/29/2020 CLINICAL DATA:  Lumbar fusion EXAM: LUMBAR SPINE - 2-3 VIEW; DG C-ARM 1-60 MIN COMPARISON:  09/25/2019 FINDINGS: Two low resolution intraoperative spot views are submitted. See epic notes regarding time stamp error on the images. The images demonstrate surgical rods and fixating screws with interbody device at L4-L5. Total fluoroscopy time was 8 minutes 35 seconds. IMPRESSION: Intraoperative fluoroscopic assistance provided during lumbar spine surgery Electronically Signed   By: Donavan Foil M.D.   On: 01/29/2020 16:24   DG C-Arm 1-60 Min  Result Date: 01/29/2020 CLINICAL DATA:  Lumbar fusion EXAM: LUMBAR SPINE - 2-3 VIEW; DG C-ARM 1-60 MIN COMPARISON:  09/25/2019 FINDINGS: Two low resolution intraoperative spot views are submitted. See epic notes regarding time stamp error on the images. The images demonstrate surgical rods and fixating screws with interbody device at L4-L5. Total fluoroscopy time was 8 minutes 35 seconds. IMPRESSION: Intraoperative fluoroscopic assistance provided during lumbar spine surgery Electronically Signed   By: Donavan Foil M.D.   On: 01/29/2020 16:24    Discharge Instructions    Incentive spirometry RT   Complete by: As directed       Follow-up [place], Dahari, MD. Call in 2 weeks.   Specialty: Orthopedic Surgery Why: If symptoms worsen, For suture removal.  Call office to confirm follow up appointment Contact information: [address] [place] [address] [phone]           Discharge Plan:  discharge to home  Disposition: stable    Signed: Yvonne Kendall Kerensa Nicklas for [facility] PA-C [facility] [phone] 02/01/2020, 5:56 PM

## 2020-02-03 MED FILL — Heparin Sodium (Porcine) Inj 1000 Unit/ML: INTRAMUSCULAR | Qty: 30 | Status: AC

## 2020-02-03 MED FILL — Sodium Chloride IV Soln 0.9%: INTRAVENOUS | Qty: 1000 | Status: AC

## 2020-11-28 ENCOUNTER — Ambulatory Visit: Payer: 59

## 2021-04-29 IMAGING — RF DG LUMBAR SPINE 2-3V
1 series · 2 of 2 positions shown · non-contrast
Comparison: 09/25/2019

CLINICAL DATA: Lumbar fusion

EXAM:
LUMBAR SPINE - 2-3 VIEW; DG C-ARM 1-60 MIN

[Series 1: run · 2 of 2 slices shown]
[im 1/2]
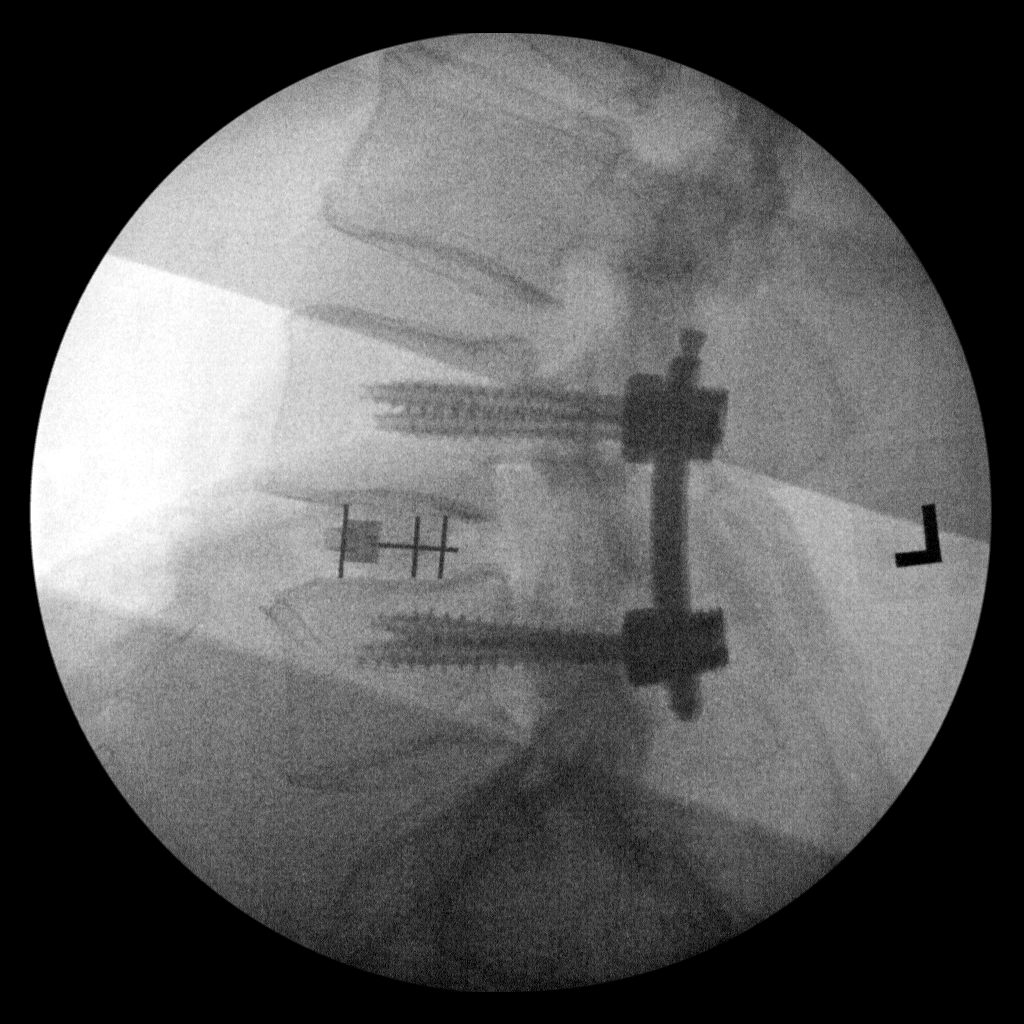
[im 2/2]
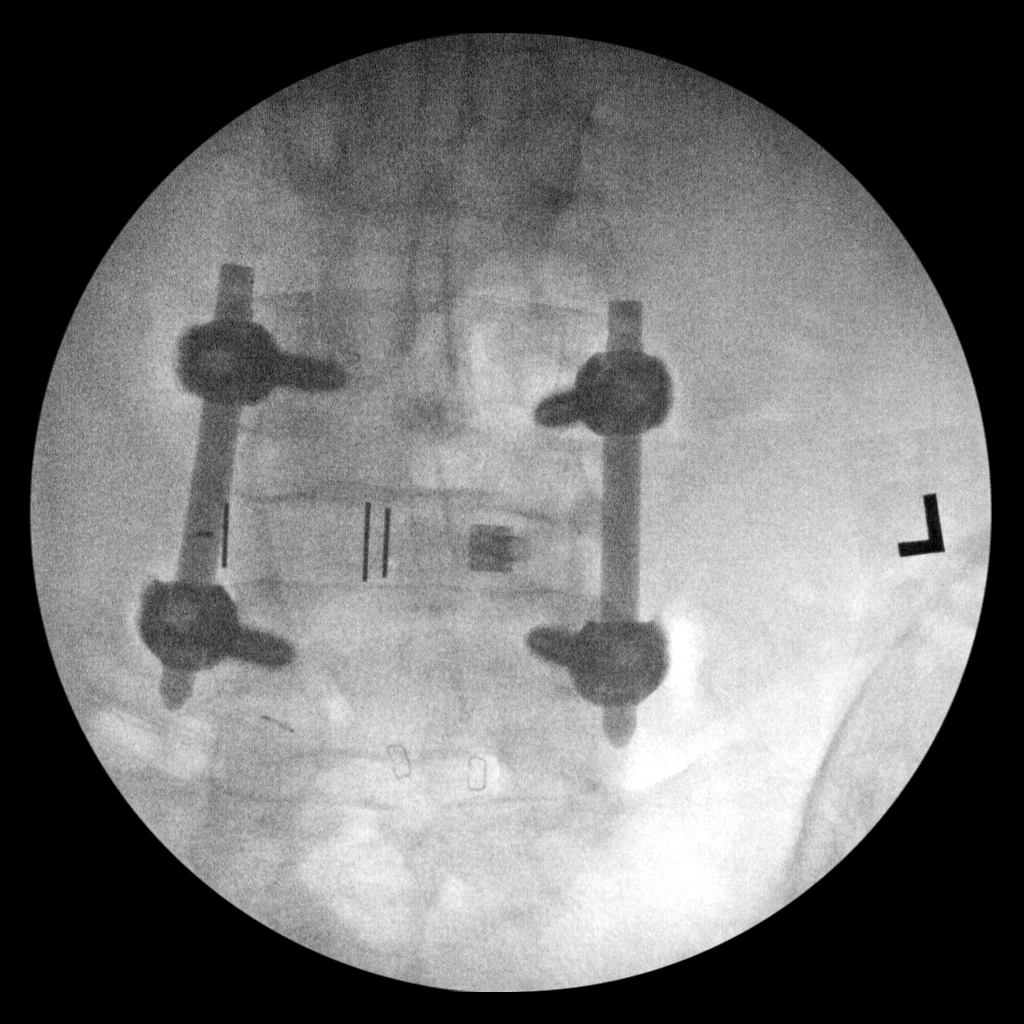

[2 of 2 positions shown; findings below may reference images not displayed]

FINDINGS: Two low resolution intraoperative spot views are submitted. See [REDACTED]
notes regarding time stamp error on the images. The images
demonstrate surgical rods and fixating screws with interbody device
at L4-L5. Total fluoroscopy time was 8 minutes 35 seconds.
IMPRESSION: Intraoperative fluoroscopic assistance provided during lumbar spine
surgery

## 2021-04-29 IMAGING — RF DG C-ARM 1-60 MIN
1 series · 2 of 2 positions shown · non-contrast
Comparison: 09/25/2019

CLINICAL DATA: Lumbar fusion

EXAM:
LUMBAR SPINE - 2-3 VIEW; DG C-ARM 1-60 MIN

[Series 1: run · 2 of 2 slices shown]
[im 1/2]
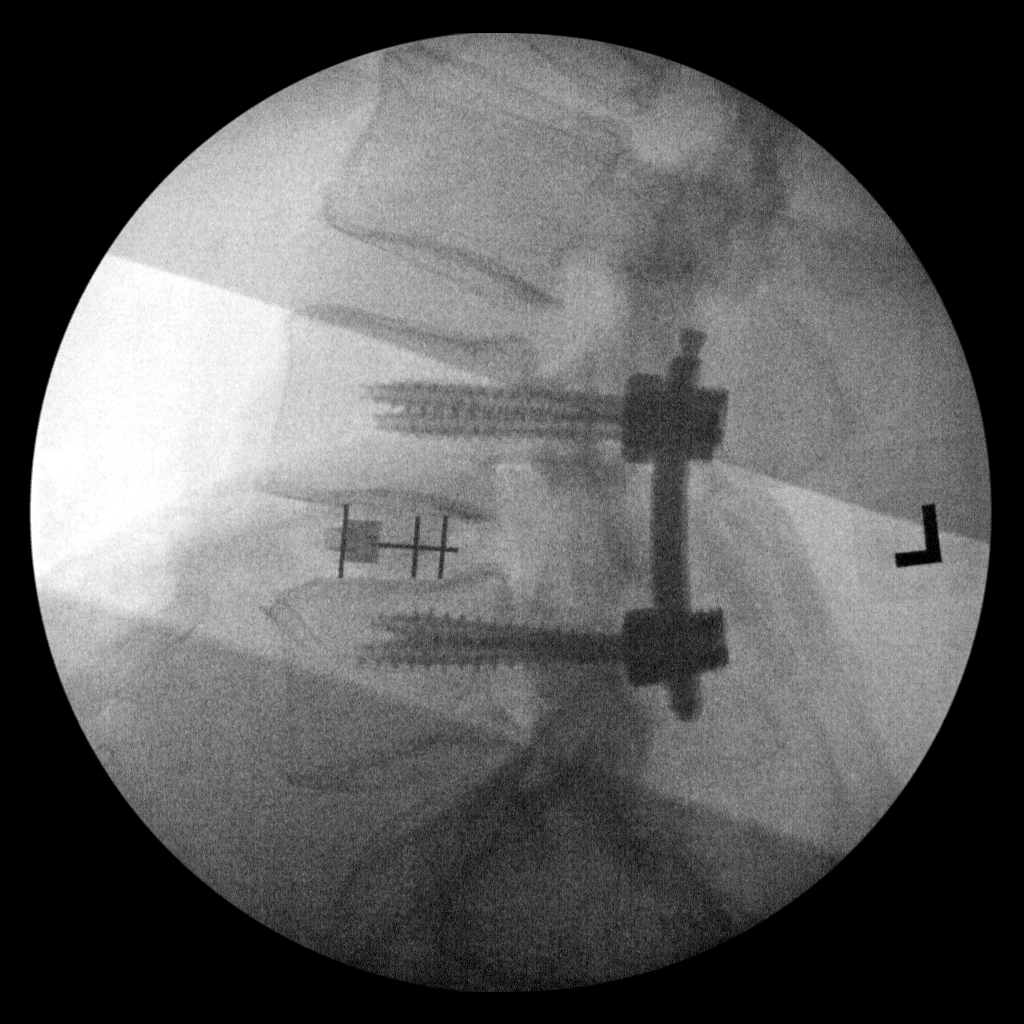
[im 2/2]
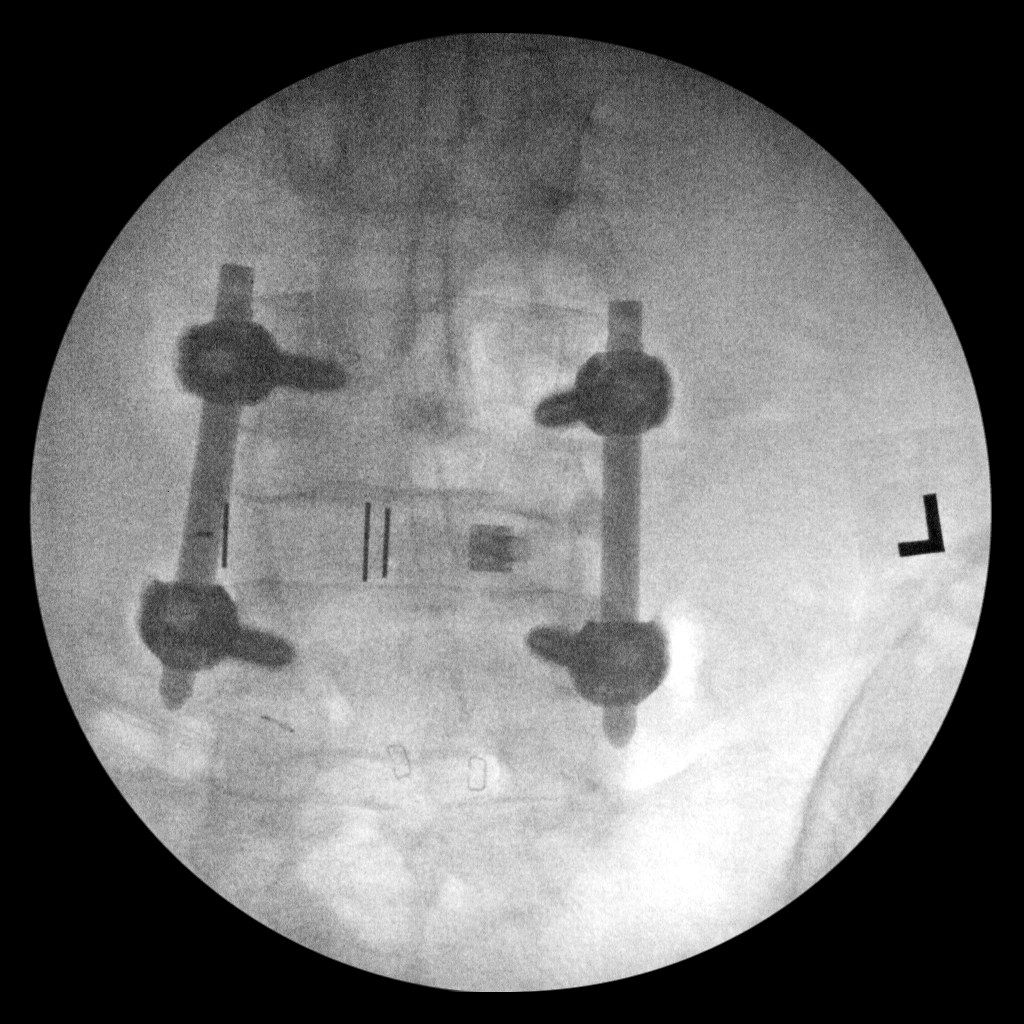

[2 of 2 positions shown; findings below may reference images not displayed]

FINDINGS: Two low resolution intraoperative spot views are submitted. See [REDACTED]
notes regarding time stamp error on the images. The images
demonstrate surgical rods and fixating screws with interbody device
at L4-L5. Total fluoroscopy time was 8 minutes 35 seconds.
IMPRESSION: Intraoperative fluoroscopic assistance provided during lumbar spine
surgery
